# Patient Record
Sex: Male | Born: 1975
Health system: Southern US, Community
[De-identification: ages and names within clinical notes are randomized; demographics above are authoritative.]

## PROBLEM LIST (undated history)

## (undated) DIAGNOSIS — E119 Type 2 diabetes mellitus without complications: Secondary | ICD-10-CM

## (undated) DIAGNOSIS — I10 Essential (primary) hypertension: Secondary | ICD-10-CM

## (undated) HISTORY — DX: Type 2 diabetes mellitus without complications: E11.9

## (undated) HISTORY — DX: Essential (primary) hypertension: I10

---

## 1999-11-28 ENCOUNTER — Emergency Department (HOSPITAL_COMMUNITY): Admission: EM | Admit: 1999-11-28 | Discharge: 1999-11-29 | Payer: Self-pay | Admitting: Emergency Medicine

## 2000-01-27 ENCOUNTER — Emergency Department (HOSPITAL_COMMUNITY): Admission: EM | Admit: 2000-01-27 | Discharge: 2000-01-27 | Payer: Self-pay | Admitting: Emergency Medicine

## 2000-02-29 ENCOUNTER — Ambulatory Visit (HOSPITAL_BASED_OUTPATIENT_CLINIC_OR_DEPARTMENT_OTHER): Admission: RE | Admit: 2000-02-29 | Discharge: 2000-02-29 | Payer: Self-pay | Admitting: Orthopedic Surgery

## 2002-09-06 ENCOUNTER — Emergency Department (HOSPITAL_COMMUNITY): Admission: EM | Admit: 2002-09-06 | Discharge: 2002-09-06 | Payer: Self-pay | Admitting: Emergency Medicine

## 2002-09-06 ENCOUNTER — Encounter: Payer: Self-pay | Admitting: Emergency Medicine

## 2005-07-25 ENCOUNTER — Emergency Department (HOSPITAL_COMMUNITY): Admission: EM | Admit: 2005-07-25 | Discharge: 2005-07-25 | Payer: Self-pay | Admitting: Emergency Medicine

## 2007-09-07 ENCOUNTER — Emergency Department (HOSPITAL_COMMUNITY): Admission: EM | Admit: 2007-09-07 | Discharge: 2007-09-07 | Payer: Self-pay | Admitting: Emergency Medicine

## 2008-03-23 ENCOUNTER — Emergency Department (HOSPITAL_COMMUNITY): Admission: EM | Admit: 2008-03-23 | Discharge: 2008-03-23 | Payer: Self-pay | Admitting: Family Medicine

## 2008-08-05 ENCOUNTER — Emergency Department (HOSPITAL_COMMUNITY): Admission: EM | Admit: 2008-08-05 | Discharge: 2008-08-05 | Payer: Self-pay | Admitting: Family Medicine

## 2009-05-21 ENCOUNTER — Emergency Department (HOSPITAL_COMMUNITY): Admission: EM | Admit: 2009-05-21 | Discharge: 2009-05-21 | Payer: Self-pay | Admitting: Family Medicine

## 2010-02-26 ENCOUNTER — Emergency Department (HOSPITAL_COMMUNITY): Admission: EM | Admit: 2010-02-26 | Discharge: 2010-02-26 | Payer: Self-pay | Admitting: Emergency Medicine

## 2010-10-29 LAB — COMPREHENSIVE METABOLIC PANEL
ALT: 37 U/L (ref 0–53)
Albumin: 4.1 g/dL (ref 3.5–5.2)
Alkaline Phosphatase: 55 U/L (ref 39–117)
GFR calc Af Amer: >60 mL/min (ref >60)
GFR calc non Af Amer: >60 mL/min (ref >60)
Glucose, Bld: 156 mg/dL — ABNORMAL HIGH (ref 70–99)
Potassium: 3.7 mEq/L (ref 3.5–5.1)
Sodium: 141 mEq/L (ref 135–145)
Total Bilirubin: 0.3 mg/dL (ref 0.3–1.2)

## 2010-10-29 LAB — CBC
HCT: 44.3 % (ref 39.0–52.0)
Platelets: 241 10*3/uL (ref 150–400)
WBC: 8.6 10*3/uL (ref 4.0–10.5)

## 2010-10-29 LAB — PROTIME-INR: Prothrombin Time: 13 seconds (ref 11.6–15.2)

## 2010-10-29 LAB — APTT: aPTT: 27 seconds (ref 24–37)

## 2010-12-30 NOTE — Op Note (Signed)
[place]  Patient:    Jesus Gray, Jesus Gray                   MRN: [id number] Proc. Date: 02/29/00 Adm. Date:  [id number] Disc. Date: [id number] Attending:  Ronne Binning CC:         Nicki Reaper, M.D. x 2                           Operative Report  PREOPERATIVE DIAGNOSIS:  Status post repair of extensor tendon laceration with rupture.  POSTOPERATIVE DIAGNOSIS:  Status post repair of extensor tendon laceration with rupture.  OPERATION:  Re-repair of extensor tendon of right index finger.  SURGEON:  Nicki Reaper, M.D.  ASSISTANT:  RN.  ANESTHESIA:  General.  INDICATIONS:  The patient is a 35 year old male who suffered a laceration to the extensor tendon.  He was originally seen where a repair was performed in an emergency room.  He was referred for continued care.  He has been seen and followed.  However, has had lack of the ability to fully extend.  He is admitted now for re-exploration and tenolysis or re-repair.  DESCRIPTION OF PROCEDURE:  The patient was brought to the operating room where a general anesthesia was carried out without difficulty.  He was prepped and draped using Betadine scrubbing solution with the right arm free.  The limb was exsanguinated with an Esmarch bandage.  The tourniquet placed high on the arm and was inflated to 250 mmHg.  A volar dorsal zigzag incision was made and carried down through the subcutaneous tissue.  The extensor tendon was identified and a rupture was immediately apparent.  A tenolysis was then performed to the extensor tendon.  The tendon was then re-repaired after removal of the scar which had formed between the torn end.  Repair was performed with figure-of-eight 4-0 Mersilene sutures.  The wound was copiously irrigated with saline and the skin was closed with interrupted 5-0 nylon suture.  A sterile compressive dressing and splint was applied.  The patient tolerated the procedure well  and was taken to the recovery room for observation in satisfactory condition.  He is discharged home to return to the [facility] in one week on Vicodin and Keflex. DD:  02/29/00 TD:  03/02/00 Job: [id number]
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
## 2011-05-19 LAB — CULTURE, ROUTINE-ABSCESS: Culture: NO GROWTH

## 2013-03-20 ENCOUNTER — Other Ambulatory Visit: Payer: Self-pay | Admitting: Family Medicine

## 2013-03-20 DIAGNOSIS — M79605 Pain in left leg: Secondary | ICD-10-CM

## 2013-03-21 ENCOUNTER — Ambulatory Visit
Admission: RE | Admit: 2013-03-21 | Discharge: 2013-03-21 | Disposition: A | Discharge status: Home / Self Care | Payer: 59 | Source: Ambulatory Visit | Attending: Family Medicine | Admitting: Family Medicine

## 2013-03-21 DIAGNOSIS — M79605 Pain in left leg: Secondary | ICD-10-CM

## 2013-06-10 ENCOUNTER — Encounter: Payer: 59 | Attending: Family Medicine | Admitting: Dietician

## 2013-06-10 VITALS — Ht 69.0 in | Wt 228.0 lb

## 2013-06-10 DIAGNOSIS — Z713 Dietary counseling and surveillance: Secondary | ICD-10-CM | POA: Insufficient documentation

## 2013-06-10 DIAGNOSIS — E119 Type 2 diabetes mellitus without complications: Secondary | ICD-10-CM | POA: Insufficient documentation

## 2013-06-11 ENCOUNTER — Encounter: Payer: Self-pay | Admitting: Dietician

## 2013-06-11 NOTE — Patient Instructions (Signed)
Goals:  Monitor glucose levels as instructed by your doctor  Bring food record and glucose log to your next nutrition visit 

## 2013-06-11 NOTE — Progress Notes (Signed)
Patient was seen on 06/10/13 for the first of a series of three diabetes self-management courses at the [facility].  Current HbA1c: 6.6 on 10/14  The following learning objectives were met by the patient during this class:  Describe diabetes  State some common risk factors for diabetes  Defines the role of glucose and insulin  Identifies type of diabetes and pathophysiology  Describe the relationship between diabetes and cardiovascular risk  State the members of the Healthcare Team  States the rationale for glucose monitoring  State when to test glucose  State their individual Target Range  State the importance of logging glucose readings  Describe how to interpret glucose readings  Identifies A1C target  Explain the correlation between A1c and eAG values  State symptoms and treatment of high blood glucose  State symptoms and treatment of low blood glucose  Explain proper technique for glucose testing  Identifies proper sharps disposal  Handouts given during class include:  Living Well with Diabetes book  Carb Counting and Meal Planning book  Meal Plan Card  Carbohydrate guide  Meal planning worksheet  Low Sodium Flavoring Tips  The diabetes portion plate  Low Carbohydrate Snack Suggestions  A1c to eAG Conversion Chart  Diabetes Medications  Stress Management  Diabetes Recommended Care Schedule  Diabetes Success Plan  Core Class Satisfaction Survey  Your patient has identified their diabetes care support plan as:  [facility]  Staff  Wife Follow-Up Plan:  Attend core 2

## 2013-07-19 ENCOUNTER — Encounter: Payer: 59 | Attending: Family Medicine

## 2013-07-19 DIAGNOSIS — E119 Type 2 diabetes mellitus without complications: Secondary | ICD-10-CM | POA: Insufficient documentation

## 2013-07-19 DIAGNOSIS — Z713 Dietary counseling and surveillance: Secondary | ICD-10-CM | POA: Insufficient documentation

## 2013-07-19 NOTE — Progress Notes (Signed)

## 2013-07-22 NOTE — Progress Notes (Signed)
Patient was seen on 07/19/13 for core two and three of series of three diabetes self-management courses at the [facility]. The following learning objectives were met by the patient during this class:    State the amount of activity recommended for healthy living   Describe activities suitable for individual needs   Identify ways to regularly incorporate activity into daily life   Identify barriers to activity and ways to over come these barriers  Identify diabetes medications being personally used and their primary action for lowering glucose and possible side effects   Describe role of stress on blood glucose and develop strategies to address psychosocial issues   Identify diabetes complications and ways to prevent them  Explain how to manage diabetes during illness   Evaluate success in meeting personal goal   Establish 2-3 goals that they will plan to diligently work on until they return for the  34-month follow-up visit  Goals:  Follow Diabetes Meal Plan as instructed  Aim for 15-30 mins of physical activity daily as tolerated  Bring food record and glucose log to your follow up visit  Your patient has established the following 4 month goals in their individualized success plan:  Count carbohydrates at most meal and snacks  Reduce fat in my diet by eating less fat at two or more meals a day  Increase activity at least 3 days a week  Test my glucose at least 2 times a day, 7 days a week  Your patient has identified these potential barriers to change:  None noted  Your patient has identified their diabetes self-care support plan as  [facility] Support Group

## 2013-11-17 ENCOUNTER — Encounter: Payer: 59 | Attending: Family Medicine | Admitting: Dietician

## 2013-11-17 VITALS — Ht 69.0 in | Wt 194.8 lb

## 2013-11-17 DIAGNOSIS — E119 Type 2 diabetes mellitus without complications: Secondary | ICD-10-CM

## 2013-11-17 DIAGNOSIS — Z713 Dietary counseling and surveillance: Secondary | ICD-10-CM | POA: Insufficient documentation

## 2013-11-18 NOTE — Progress Notes (Signed)
Patient was seen on 11/18/2013 for a review of the series of three diabetes self-management courses at the [facility]. The following learning objectives were met by the patient during this class:    Reviewed blood glucose monitoring and interpretation including the recommended target ranges and Hgb A1c.    Reviewed on carb counting, importance of regularly scheduled meals/snacks, and meal planning.    Reviewed the effects of physical activity on glucose levels and long-term glucose control.  Recommended goal of 150 minutes of physical activity/week.   Reviewed patient medications and discussed role of medication on blood glucose and possible side effects.   Discussed strategies to manage stress, psychosocial issues, and other obstacles to diabetes management.   Encouraged moderate weight reduction to improve glucose levels.     Reviewed short-term complications: hyper- and hypo-glycemia.  Discussed causes, symptoms, and treatment options.   Reviewed prevention, detection, and treatment of long-term complications.  Discussed the role of prolonged elevated glucose levels on body systems.  Goals:  Follow Diabetes Meal Plan as instructed  Eat 3 meals and 2 snacks, every 3-5 hrs  Limit carbohydrate intake to 45 grams carbohydrate/meal Limit carbohydrate intake to 15 grams carbohydrate/snack Add lean protein foods to meals/snacks  Monitor glucose levels as instructed by your doctor  Aim for goal of 15-30 mins of physical activity daily as tolerated  Bring food record and glucose log to your next nutrition visit                        Patient ID: ,    DOB: , 38 y.o.   MRN: [id number]  HPI   Review of Systems     Physical Exam

## 2013-11-20 ENCOUNTER — Ambulatory Visit: Payer: 59 | Admitting: *Deleted

## 2017-02-23 DIAGNOSIS — I10 Essential (primary) hypertension: Secondary | ICD-10-CM | POA: Diagnosis not present

## 2017-03-13 DIAGNOSIS — R1012 Left upper quadrant pain: Secondary | ICD-10-CM | POA: Diagnosis not present

## 2017-03-13 DIAGNOSIS — M799 Soft tissue disorder, unspecified: Secondary | ICD-10-CM | POA: Diagnosis not present

## 2017-03-19 DIAGNOSIS — R1900 Intra-abdominal and pelvic swelling, mass and lump, unspecified site: Secondary | ICD-10-CM | POA: Diagnosis not present

## 2017-03-20 ENCOUNTER — Other Ambulatory Visit: Payer: Self-pay | Admitting: Family Medicine

## 2017-03-21 ENCOUNTER — Other Ambulatory Visit: Payer: Self-pay | Admitting: Family Medicine

## 2017-03-21 DIAGNOSIS — R1900 Intra-abdominal and pelvic swelling, mass and lump, unspecified site: Secondary | ICD-10-CM

## 2017-03-22 ENCOUNTER — Ambulatory Visit
Admission: RE | Admit: 2017-03-22 | Discharge: 2017-03-22 | Disposition: A | Discharge status: Home / Self Care | Payer: 59 | Source: Ambulatory Visit | Attending: Family Medicine | Admitting: Family Medicine

## 2017-03-22 DIAGNOSIS — R1900 Intra-abdominal and pelvic swelling, mass and lump, unspecified site: Secondary | ICD-10-CM | POA: Diagnosis not present

## 2018-05-09 DIAGNOSIS — Z131 Encounter for screening for diabetes mellitus: Secondary | ICD-10-CM | POA: Diagnosis not present

## 2018-05-13 DIAGNOSIS — R300 Dysuria: Secondary | ICD-10-CM | POA: Diagnosis not present

## 2018-05-13 DIAGNOSIS — N342 Other urethritis: Secondary | ICD-10-CM | POA: Diagnosis not present

## 2018-06-21 DIAGNOSIS — E119 Type 2 diabetes mellitus without complications: Secondary | ICD-10-CM | POA: Diagnosis not present

## 2018-06-21 DIAGNOSIS — Z23 Encounter for immunization: Secondary | ICD-10-CM | POA: Diagnosis not present

## 2018-06-21 DIAGNOSIS — Z0000 Encounter for general adult medical examination without abnormal findings: Secondary | ICD-10-CM | POA: Diagnosis not present

## 2018-06-21 DIAGNOSIS — Z13220 Encounter for screening for lipoid disorders: Secondary | ICD-10-CM | POA: Diagnosis not present

## 2018-09-13 DIAGNOSIS — R079 Chest pain, unspecified: Secondary | ICD-10-CM | POA: Diagnosis not present

## 2019-07-28 ENCOUNTER — Other Ambulatory Visit: Payer: Self-pay

## 2019-07-28 ENCOUNTER — Ambulatory Visit: Payer: 59 | Admitting: Podiatry

## 2019-07-28 DIAGNOSIS — M79674 Pain in right toe(s): Secondary | ICD-10-CM

## 2019-07-28 DIAGNOSIS — B351 Tinea unguium: Secondary | ICD-10-CM

## 2019-07-28 DIAGNOSIS — M79675 Pain in left toe(s): Secondary | ICD-10-CM | POA: Diagnosis not present

## 2019-07-28 MED ORDER — TERBINAFINE HCL 250 MG PO TABS: 250.0000 mg | ORAL_TABLET | Freq: Every day | ORAL | 2 refills | Status: DC

## 2019-07-31 NOTE — Progress Notes (Signed)
   Subjective: 43 y.o. male presenting today as a new patient with a chief complaint of possible nail fungus of the bilateral great toenails that first appeared about 1.5 years ago. He reports associated thickening and discoloration of the nails. He reports pain when trying to trim his nails. He has not had any treatment for the symptoms. Patient is here for further evaluation and treatment.   Past Medical History:  Diagnosis Date  . Diabetes mellitus without complication   . Hypertension     Objective: Physical Exam General: The patient is alert and oriented x3 in no acute distress.  Dermatology: Hyperkeratotic, discolored, thickened, onychodystrophy noted to the bilateral great toenails. Skin is warm, dry and supple bilateral lower extremities. Negative for open lesions or macerations.  Vascular: Palpable pedal pulses bilaterally. No edema or erythema noted. Capillary refill within normal limits.  Neurological: Epicritic and protective threshold grossly intact bilaterally.   Musculoskeletal Exam: Range of motion within normal limits to all pedal and ankle joints bilateral. Muscle strength 5/5 in all groups bilateral.   Assessment: #1 Onychomycosis bilateral great toenails  #2 Hyperkeratotic nails bilateral great toenails   Plan of Care:  #1 Patient was evaluated. #2 Prescription for Lamisil 250 mg #90 provided to patient.  #3 Recommended good shoe gear.  #4 If in 6 months patient would like another round of antifungal, we will order LFT prior to 2nd round of treatment.  #5 Return to clinic as needed.   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    Edrick Kins, DPM [facility]  Dr. Edrick Kins, [address]                                        [place], [place] [address]                Office [phone]  Fax [phone]

## 2019-09-16 ENCOUNTER — Ambulatory Visit: Payer: 59 | Attending: Internal Medicine

## 2019-11-06 ENCOUNTER — Ambulatory Visit: Payer: 59 | Attending: Internal Medicine

## 2019-11-06 DIAGNOSIS — Z23 Encounter for immunization: Secondary | ICD-10-CM

## 2019-11-06 NOTE — Progress Notes (Signed)
   Covid-19 Vaccination Clinic  Name:  Jesus Gray    MRN: [id number] DOB: January 15, 1976  11/06/2019  [name] was observed post Covid-19 immunization for 15 minutes without incident. He was provided with Vaccine Information Sheet and instruction to access the V-Safe system.   [name] was instructed to call 911 with any severe reactions post vaccine: Marland Kitchen Difficulty breathing  . Swelling of face and throat  . A fast heartbeat  . A bad rash all over body  . Dizziness and weakness   Immunizations Administered    Name Date Dose VIS Date Route   Pfizer COVID-19 Vaccine 11/06/2019  4:36 PM 0.3 mL 07/25/2019 Intramuscular   Manufacturer: [employer], [employer]   Lot: [id number]   NDC: [address]-3

## 2019-12-01 ENCOUNTER — Ambulatory Visit: Payer: 59 | Attending: Internal Medicine

## 2019-12-01 DIAGNOSIS — Z23 Encounter for immunization: Secondary | ICD-10-CM

## 2019-12-01 NOTE — Progress Notes (Signed)
   Covid-19 Vaccination Clinic  Name:  GRAM SIEDLECKI    MRN: [id number] DOB: 14-Oct-1975  12/01/2019  Mr. Vanderbeck was observed post Covid-19 immunization for 15 minutes without incident. He was provided with Vaccine Information Sheet and instruction to access the V-Safe system.   Mr. Trueheart was instructed to call 911 with any severe reactions post vaccine: Marland Kitchen Difficulty breathing  . Swelling of face and throat  . A fast heartbeat  . A bad rash all over body  . Dizziness and weakness   Immunizations Administered    Name Date Dose VIS Date Route   Pfizer COVID-19 Vaccine 12/01/2019  4:23 PM 0.3 mL 10/08/2018 Intramuscular   Manufacturer: [employer], [employer]   Lot: [id number]   NDC: [address]-7

## 2022-02-16 ENCOUNTER — Ambulatory Visit
Admission: RE | Admit: 2022-02-16 | Discharge: 2022-02-16 | Disposition: A | Discharge status: Home / Self Care | Payer: 59 | Source: Ambulatory Visit | Attending: Family Medicine | Admitting: Family Medicine

## 2022-02-16 ENCOUNTER — Other Ambulatory Visit: Payer: Self-pay | Admitting: Family Medicine

## 2022-02-16 DIAGNOSIS — M549 Dorsalgia, unspecified: Secondary | ICD-10-CM

## 2022-03-09 ENCOUNTER — Encounter: Payer: Self-pay | Admitting: Physical Medicine & Rehabilitation

## 2022-03-09 ENCOUNTER — Encounter: Payer: 59 | Attending: Physical Medicine & Rehabilitation | Admitting: Physical Medicine & Rehabilitation

## 2022-03-09 VITALS — BP 147/90 | HR 81 | Ht 71.0 in | Wt 230.0 lb

## 2022-03-09 DIAGNOSIS — M5126 Other intervertebral disc displacement, lumbar region: Secondary | ICD-10-CM | POA: Diagnosis not present

## 2022-03-09 MED ORDER — ETODOLAC 400 MG PO TABS: 400.0000 mg | ORAL_TABLET | Freq: Every day | ORAL | 1 refills | Status: AC

## 2022-03-09 NOTE — Patient Instructions (Signed)
Back Exercises These exercises help to make your trunk and back strong. They also help to keep the lower back flexible. Doing these exercises can help to prevent or lessen pain in your lower back. If you have back pain, try to do these exercises 2-3 times each day or as told by your doctor. As you get better, do the exercises once each day. Repeat the exercises more often as told by your doctor. To stop back pain from coming back, do the exercises once each day, or as told by your doctor. Do exercises exactly as told by your doctor. Stop right away if you feel sudden pain or your pain gets worse. Exercises Single knee to chest Do these steps 3-5 times in a row for each leg: Lie on your back on a firm bed or the floor with your legs stretched out. Bring one knee to your chest. Grab your knee or thigh with both hands and hold it in place. Pull on your knee until you feel a gentle stretch in your lower back or butt. Keep doing the stretch for 10-30 seconds. Slowly let go of your leg and straighten it. Pelvic tilt Do these steps 5-10 times in a row: Lie on your back on a firm bed or the floor with your legs stretched out. Bend your knees so they point up to the ceiling. Your feet should be flat on the floor. Tighten your lower belly (abdomen) muscles to press your lower back against the floor. This will make your tailbone point up to the ceiling instead of pointing down to your feet or the floor. Stay in this position for 5-10 seconds while you gently tighten your muscles and breathe evenly. Cat-cow Do these steps until your lower back bends more easily: Get on your hands and knees on a firm bed or the floor. Keep your hands under your shoulders, and keep your knees under your hips. You may put padding under your knees. Let your head hang down toward your chest. Tighten (contract) the muscles in your belly. Point your tailbone toward the floor so your lower back becomes rounded like the back of a  cat. Stay in this position for 5 seconds. Slowly lift your head. Let the muscles of your belly relax. Point your tailbone up toward the ceiling so your back forms a sagging arch like the back of a cow. Stay in this position for 5 seconds.  Press-ups Do these steps 5-10 times in a row: Lie on your belly (face-down) on a firm bed or the floor. Place your hands near your head, about shoulder-width apart. While you keep your back relaxed and keep your hips on the floor, slowly straighten your arms to raise the top half of your body and lift your shoulders. Do not use your back muscles. You may change where you place your hands to make yourself more comfortable. Stay in this position for 5 seconds. Keep your back relaxed. Slowly return to lying flat on the floor.  Bridges Do these steps 10 times in a row: Lie on your back on a firm bed or the floor. Bend your knees so they point up to the ceiling. Your feet should be flat on the floor. Your arms should be flat at your sides, next to your body. Tighten your butt muscles and lift your butt off the floor until your waist is almost as high as your knees. If you do not feel the muscles working in your butt and the back of   your thighs, slide your feet 1-2 inches (2.5-5 cm) farther away from your butt. Stay in this position for 3-5 seconds. Slowly lower your butt to the floor, and let your butt muscles relax. If this exercise is too easy, try doing it with your arms crossed over your chest. Belly crunches Do these steps 5-10 times in a row: Lie on your back on a firm bed or the floor with your legs stretched out. Bend your knees so they point up to the ceiling. Your feet should be flat on the floor. Cross your arms over your chest. Tip your chin a little bit toward your chest, but do not bend your neck. Tighten your belly muscles and slowly raise your chest just enough to lift your shoulder blades a tiny bit off the floor. Avoid raising your body  higher than that because it can put too much stress on your lower back. Slowly lower your chest and your head to the floor. Back lifts Do these steps 5-10 times in a row: Lie on your belly (face-down) with your arms at your sides, and rest your forehead on the floor. Tighten the muscles in your legs and your butt. Slowly lift your chest off the floor while you keep your hips on the floor. Keep the back of your head in line with the curve in your back. Look at the floor while you do this. Stay in this position for 3-5 seconds. Slowly lower your chest and your face to the floor. Contact a doctor if: Your back pain gets a lot worse when you do an exercise. Your back pain does not get better within 2 hours after you exercise. If you have any of these problems, stop doing the exercises. Do not do them again unless your doctor says it is okay. Get help right away if: You have sudden, very bad back pain. If this happens, stop doing the exercises. Do not do them again unless your doctor says it is okay. This information is not intended to replace advice given to you by your health care provider. Make sure you discuss any questions you have with your health care provider. Document Revised: 10/13/2020 Document Reviewed: 10/13/2020 Elsevier Patient Education  2023 Elsevier Inc.  

## 2022-03-09 NOTE — Progress Notes (Signed)
Subjective:    Patient ID: Jesus Gray, male    DOB: February 18, 1976, 46 y.o.   MRN: [id number]  HPI  CC:  Low back and RIght thigh pain  46 year old male referred by his primary care physician Dr. Laurine Blazer for chronic low back pain.   Onset ~66mo ago, worsened a couple weeks ago Dull achy constant pain but occ becomes sharp , especially with prolonged sitting or climbing steps  Right post thigh pain  The patient is employed 55 hours a week.  Jesus Gray has no issues with self-care.  The patient does not exercise on a regular basis  Spine Xray minimal anterolisthesis L4 on L5  PMH- HTN, DM  Pain Inventory Average Pain 9 Pain Right Now 6 My pain is sharp, tingling, and aching  In the last 24 hours, has pain interfered with the following? General activity 2 Relation with others 2 Enjoyment of life 2 What TIME of day is your pain at its worst? morning , daytime, evening, and night Sleep (in general) Fair  Pain is worse with: walking, bending, sitting, and standing Pain improves with: medication Relief from Meds: 5  walk without assistance ability to climb steps?  yes do you drive?  yes transfers alone  employed # of hrs/week 55 what is your job? NA  No problems in this area  Any changes since last visit?  no  Any changes since last visit?  no    Family History  Problem Relation Age of Onset   Hypertension Other    Hyperlipidemia Other    Diabetes Other    Social History   Socioeconomic History   Marital status: Married    Spouse name: Not on file   Number of children: Not on file   Years of education: Not on file   Highest education level: Not on file  Occupational History   Not on file  Tobacco Use   Smoking status: Not on file   Smokeless tobacco: Not on file  Substance and Sexual Activity   Alcohol use: Not on file   Drug use: Not on file   Sexual activity: Not on file  Other Topics Concern   Not on file  Social History Narrative   Not on file    Social Determinants of Health   Financial Resource Strain: Not on file  Food Insecurity: Not on file  Transportation Needs: Not on file  Physical Activity: Not on file  Stress: Not on file  Social Connections: Not on file   No past surgical history on file. Past Medical History:  Diagnosis Date   Diabetes mellitus without complication (HCC)    Hypertension    BP (!) 147/90   Pulse 81   Ht 5\' 11"  (1.803 m)   Wt 230 lb (104.3 kg)   SpO2 95%   BMI 32.08 kg/m   Opioid Risk Score:   Fall Risk Score:  `1  Depression screen [facility] 2/9     03/09/2022    1:56 PM  Depression screen PHQ 2/9  Decreased Interest 0  Down, Depressed, Hopeless 0  PHQ - 2 Score 0  Altered sleeping 0  Tired, decreased energy 3  Change in appetite 0  Feeling bad or failure about yourself  0  Trouble concentrating 0  Moving slowly or fidgety/restless 0  Suicidal thoughts 0  PHQ-9 Score 3  Difficult doing work/chores Not difficult at all      Review of Systems  Musculoskeletal:  Positive for back pain.  Pain in right hip going down back of leg to knee  All other systems reviewed and are negative.     Objective:   Physical Exam HENT:     Head: Normocephalic and atraumatic.  Eyes:     Extraocular Movements: Extraocular movements intact.     Conjunctiva/sclera: Conjunctivae normal.     Pupils: Pupils are equal, round, and reactive to light.  Musculoskeletal:        General: No tenderness.     Comments: Pain with spine flexion  Skin:    General: Skin is warm and dry.  Neurological:     Mental Status: Jesus Gray is alert and oriented to person, place, and time.     Sensory: Sensation is intact.     Motor: Motor function is intact.     Coordination: Coordination is intact.     Gait: Gait is intact.     Deep Tendon Reflexes:     Reflex Scores:      Patellar reflexes are 1+ on the right side and 1+ on the left side.      Achilles reflexes are 0 on the right side and 1+ on the left side.     Comments: SLR + at 40 deg on Right side causes pain down to RIght posterior thigh   Right side tighter than left side with glut/piriformis stretch  Pain increased with forward flexion of lumbar spine    Psychiatric:        Mood and Affect: Mood normal.        Behavior: Behavior normal.    Sensation normal in bilateral L3-L4-L5 S1 dermatomal distribution        Assessment & Plan:   1.  Low back and right posterior thigh pain.  X-rays show a grade 1 anterolisthesis L4-5 but otherwise no disc space loss.  Sacroiliac joints look okay.  There are some sciatic type symptoms but no neurologic deficits other than absent right Achilles reflex.  Patient also may have high hamstring tendinopathy.  Will refer to physical therapy.  See the patient back in 4 to 6 weeks if no better consider MRI.

## 2022-03-22 NOTE — Therapy (Signed)
OUTPATIENT PHYSICAL THERAPY THORACOLUMBAR EVALUATION   Patient Name: Jesus Gray MRN: [id number] DOB:09/22/1975, 46 y.o., male Today's Date: 03/22/2022    Past Medical History:  Diagnosis Date   Diabetes mellitus without complication (HCC)    Hypertension    No past surgical history on file. Patient Active Problem List   Diagnosis Date Noted   Type II or unspecified type diabetes mellitus without mention of complication, not stated as uncontrolled 07/19/2013    PCP: Mila Palmer  REFERRING PROVIDER: Claudette Laws  REFERRING DIAG: M51.26  Rationale for Evaluation and Treatment Rehabilitation  THERAPY DIAG:  No diagnosis found.  ONSET DATE: 03/09/22  SUBJECTIVE:                                                                                                                                                                                           SUBJECTIVE STATEMENT: Started months ago randomly, and I thought it would just get better but it got worse. Going up stairs to my bed room is hard.   PERTINENT HISTORY:  Type II DM, HTN  PAIN:  Are you having pain? Yes: NPRS scale: 4/10 Pain location: low back more towards right side Pain description: burning, achy Aggravating factors: sitting for too long Relieving factors: pain medicine   PRECAUTIONS: None  WEIGHT BEARING RESTRICTIONS No  FALLS:  Has patient fallen in last 6 months? No  LIVING ENVIRONMENT: Lives with: lives with their family Lives in: House/apartment Stairs: Yes: Internal: 14 steps; on left going up Has following equipment at home: None  OCCUPATION: truck driver   PLOF: Independent  PATIENT GOALS get rid of pain   OBJECTIVE:   DIAGNOSTIC FINDINGS:  FINDINGS: Lumbar vertebral body height are preserved without evidence of fracture. Minimal grade 1 anterolisthesis of L4 on L5. No spondylolysis identified. Intervertebral disc spaces appear preserved.   IMPRESSION: No acute  osseous abnormality identified.  FINDINGS: The sacroiliac joint spaces are maintained and there is no evidence of arthropathy. No other bone abnormalities are seen.   IMPRESSION: Negative.   PATIENT SURVEYS:  FOTO 69/100  SCREENING FOR RED FLAGS: Bowel or bladder incontinence: No Spinal tumors: No Cauda equina syndrome: No Compression fracture: No Abdominal aneurysm: No  COGNITION:  Overall cognitive status: Within functional limits for tasks assessed     SENSATION: WFL  MUSCLE LENGTH: Hamstrings: tightness on both side, more on R. Positive Faber--tightness in IR/ER and piriformis   POSTURE: rounded shoulders, forward head, decreased lumbar lordosis, and flexed trunk   PALPATION: TTP on L1-L5, patient has a jump reaction with light pressure on L3-L4   LUMBAR ROM:   Active  A/PROM  eval  Flexion Full but pain on R side  Extension Limited w/pain on R side  Right lateral flexion WNL  Left lateral flexion WNL  Right rotation Limited by pain  Left rotation Limited by pain   (Blank rows = not tested)  LOWER EXTREMITY ROM:  WFL  LOWER EXTREMITY MMT:  grossly 4+/5 bilaterally but pain with all testing on R side. 4/5 hip abductors and R side hip ext 3+/5  LUMBAR SPECIAL TESTS:  Straight leg raise test: Positive, Slump test: Negative, and FABER test: Positive  FUNCTIONAL TESTS:  5 times sit to stand: 17.51s was painful Timed up and go (TUG): 10.22s  GAIT: Distance walked: in clinic distances  Assistive device utilized: None Level of assistance: Complete Independence Comments: slowed gait, antalgic gait, trendelenberg    TODAY'S TREATMENT  Eval and POC    PATIENT EDUCATION:  Education details: POC Person educated: Patient Education method: Explanation Education comprehension: verbalized understanding   HOME EXERCISE PROGRAM: SK2C bilat 30s Trunk rotations  Bridges   ASSESSMENT:  CLINICAL IMPRESSION: Patient is a 46 y.o. male who was seen today  for physical therapy evaluation and treatment for low back pain. 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 The back pain is acute and started a few months ago. He presents with tightness and is TTP along his lumbar spine. Patient has pain with most lumbar movements and with walking as well. He will benefit from skilled PT intervention to address LBP to improve QOL and ease with work and ADLs.   REHAB POTENTIAL: Good  CLINICAL DECISION MAKING: Stable/uncomplicated  EVALUATION COMPLEXITY: Low   GOALS: Goals reviewed with patient? Yes  SHORT TERM GOALS: Target date: 04/27/22  Patient will be independent with initial HEP.  Goal status: INITIAL   LONG TERM GOALS: Target date: 06/15/22  Patient will be independent with advanced/ongoing HEP to improve outcomes and carryover.  Goal status: INITIAL  2.  Patient will report 75% improvement in low back pain to improve QOL.  Goal status: INITIAL  4.  Patient will demonstrate full pain free lumbar ROM to perform ADLs.   Baseline: 9/10 Goal status: INITIAL  5.  Patient will demonstrate improved functional strength as demonstrated by hip abductor and extensor strength >=4+/5. Goal status: INITIAL  6.  Patient will report 75 on lumbar FOTO to demonstrate improved functional ability.  Baseline: 69 Goal status: INITIAL     PLAN: PT FREQUENCY: 1-2x/week  PT DURATION: 8 weeks  PLANNED INTERVENTIONS: Therapeutic exercises, Therapeutic activity, Neuromuscular re-education, Balance training, Gait training, Patient/Family education, Self Care, Joint mobilization, Dry Needling, Electrical stimulation, Spinal manipulation, Spinal mobilization, Cryotherapy, Moist heat, Taping, Traction, Ionotophoresis 4mg /ml Dexamethasone, and Manual therapy.  PLAN FOR NEXT SESSION: gym activities for low back, stretching    , PT 03/22/2022, 4:10 PM

## 2022-03-23 ENCOUNTER — Ambulatory Visit: Payer: 59 | Attending: Physical Medicine & Rehabilitation

## 2022-03-23 DIAGNOSIS — M5459 Other low back pain: Secondary | ICD-10-CM | POA: Diagnosis present

## 2022-03-23 DIAGNOSIS — M5126 Other intervertebral disc displacement, lumbar region: Secondary | ICD-10-CM | POA: Diagnosis not present

## 2022-03-23 DIAGNOSIS — M6281 Muscle weakness (generalized): Secondary | ICD-10-CM | POA: Diagnosis present

## 2022-03-23 DIAGNOSIS — M5416 Radiculopathy, lumbar region: Secondary | ICD-10-CM | POA: Diagnosis present

## 2022-03-23 DIAGNOSIS — R278 Other lack of coordination: Secondary | ICD-10-CM | POA: Diagnosis present

## 2022-03-29 ENCOUNTER — Ambulatory Visit: Payer: 59 | Admitting: Physical Therapy

## 2022-04-05 ENCOUNTER — Ambulatory Visit: Payer: 59 | Admitting: Physical Therapy

## 2022-04-05 ENCOUNTER — Encounter: Payer: Self-pay | Admitting: Physical Therapy

## 2022-04-05 DIAGNOSIS — M5459 Other low back pain: Secondary | ICD-10-CM

## 2022-04-05 DIAGNOSIS — M5416 Radiculopathy, lumbar region: Secondary | ICD-10-CM

## 2022-04-05 DIAGNOSIS — M6281 Muscle weakness (generalized): Secondary | ICD-10-CM

## 2022-04-05 DIAGNOSIS — R278 Other lack of coordination: Secondary | ICD-10-CM

## 2022-04-05 NOTE — Therapy (Signed)
OUTPATIENT PHYSICAL THERAPY THORACOLUMBAR TREATMENT   Patient Name: Jesus Gray MRN: [id number] DOB:08/22/1975, 46 y.o., male Today's Date: 04/05/2022   PT End of Session - 04/05/22 1519     Visit Number 2    Date for PT Re-Evaluation 06/15/22    PT Start Time 1515    PT Stop Time 1600    PT Time Calculation (min) 45 min    Activity Tolerance Patient tolerated treatment well    Behavior During Therapy [facility] for tasks assessed/performed             Past Medical History:  Diagnosis Date   Diabetes mellitus without complication (HCC)    Hypertension    History reviewed. No pertinent surgical history. Patient Active Problem List   Diagnosis Date Noted   Type II or unspecified type diabetes mellitus without mention of complication, not stated as uncontrolled 07/19/2013    PCP: Mila Palmer  REFERRING PROVIDER: Claudette Laws  REFERRING DIAG: M51.26  Rationale for Evaluation and Treatment Rehabilitation  THERAPY DIAG:  Muscle weakness (generalized)  Other low back pain  Radiculopathy, lumbar region  Other lack of coordination  ONSET DATE: 03/09/22  SUBJECTIVE:                                                                                                                                                                                           SUBJECTIVE STATEMENT: No change since evaluation  PERTINENT HISTORY:  Type II DM, HTN  PAIN:  Are you having pain? Yes: NPRS scale: 0/10 Pain location: low back more towards right side Pain description: burning, achy Aggravating factors: sitting for too long Relieving factors: pain medicine   PRECAUTIONS: None  WEIGHT BEARING RESTRICTIONS No  FALLS:  Has patient fallen in last 6 months? No  LIVING ENVIRONMENT: Lives with: lives with their family Lives in: House/apartment Stairs: Yes: Internal: 14 steps; on left going up Has following equipment at home: None  OCCUPATION: truck driver   PLOF:  Independent  PATIENT GOALS get rid of pain   OBJECTIVE:   DIAGNOSTIC FINDINGS:  FINDINGS: Lumbar vertebral body height are preserved without evidence of fracture. Minimal grade 1 anterolisthesis of L4 on L5. No spondylolysis identified. Intervertebral disc spaces appear preserved.   IMPRESSION: No acute osseous abnormality identified.  FINDINGS: The sacroiliac joint spaces are maintained and there is no evidence of arthropathy. No other bone abnormalities are seen.   IMPRESSION: Negative.   PATIENT SURVEYS:  FOTO 69/100  SCREENING FOR RED FLAGS: Bowel or bladder incontinence: No Spinal tumors: No Cauda equina syndrome: No Compression fracture: No Abdominal aneurysm: No  COGNITION:  Overall cognitive  status: Within functional limits for tasks assessed     SENSATION: WFL  MUSCLE LENGTH: Hamstrings: tightness on both side, more on R. Positive Faber--tightness in IR/ER and piriformis   POSTURE: rounded shoulders, forward head, decreased lumbar lordosis, and flexed trunk   PALPATION: TTP on L1-L5, patient has a jump reaction with light pressure on L3-L4   LUMBAR ROM:   Active  A/PROM  eval  Flexion Full but pain on R side  Extension Limited w/pain on R side  Right lateral flexion WNL  Left lateral flexion WNL  Right rotation Limited by pain  Left rotation Limited by pain   (Blank rows = not tested)  LOWER EXTREMITY ROM:  WFL  LOWER EXTREMITY MMT:  grossly 4+/5 bilaterally but pain with all testing on R side. 4/5 hip abductors and R side hip ext 3+/5  LUMBAR SPECIAL TESTS:  Straight leg raise test: Positive, Slump test: Negative, and FABER test: Positive  FUNCTIONAL TESTS:  5 times sit to stand: 17.51s was painful Timed up and go (TUG): 10.22s  GAIT: Distance walked: in clinic distances  Assistive device utilized: None Level of assistance: Complete Independence Comments: slowed gait, antalgic gait, trendelenberg    TODAY'S TREATMENT   04/05/22 NuStep L5 x 6 min  Rows & Lats 25lb 2x10 Lumbar Ext black 2x10 Shoulder Ext 10lb 2x10 S2S 2x10 Supine bridges x10  LE stretches piriformis, HS, ITB Lumbar stretches single and double K2C   Eval and POC    PATIENT EDUCATION:  Education details: POC Person educated: Patient Education method: Explanation Education comprehension: verbalized understanding   HOME EXERCISE PROGRAM: SK2C bilat 30s Trunk rotations  Bridges   ASSESSMENT:  CLINICAL IMPRESSION: Pt enters reporting no change. He continues to report that he has random bouts of pan. He did well with seated rows and lats. Some postural weakness present with shoulder extensions. Forward founded shoulders noted with sit to stands. Bilateral piriformis and lumbar tightness present with stretching.   REHAB POTENTIAL: Good  CLINICAL DECISION MAKING: Stable/uncomplicated  EVALUATION COMPLEXITY: Low   GOALS: Goals reviewed with patient? Yes  SHORT TERM GOALS: Target date: 04/27/22  Patient will be independent with initial HEP.  Goal status: progressing   LONG TERM GOALS: Target date: 06/15/22  Patient will be independent with advanced/ongoing HEP to improve outcomes and carryover.  Goal status: INITIAL  2.  Patient will report 75% improvement in low back pain to improve QOL.  Goal status: INITIAL  4.  Patient will demonstrate full pain free lumbar ROM to perform ADLs.   Baseline: 9/10 Goal status: INITIAL  5.  Patient will demonstrate improved functional strength as demonstrated by hip abductor and extensor strength >=4+/5. Goal status: INITIAL  6.  Patient will report 28 on lumbar FOTO to demonstrate improved functional ability.  Baseline: 69 Goal status: INITIAL     PLAN: PT FREQUENCY: 1-2x/week  PT DURATION: 8 weeks  PLANNED INTERVENTIONS: Therapeutic exercises, Therapeutic activity, Neuromuscular re-education, Balance training, Gait training, Patient/Family education, Self Care, Joint  mobilization, Dry Needling, Electrical stimulation, Spinal manipulation, Spinal mobilization, Cryotherapy, Moist heat, Taping, Traction, Ionotophoresis 4mg /ml Dexamethasone, and Manual therapy.  PLAN FOR NEXT SESSION: gym activities for low back, stretching    , PTA 04/05/2022, 3:20 PM

## 2022-04-10 ENCOUNTER — Ambulatory Visit: Payer: 59 | Admitting: Physical Therapy

## 2022-04-10 ENCOUNTER — Encounter: Payer: Self-pay | Admitting: Physical Therapy

## 2022-04-10 DIAGNOSIS — M6281 Muscle weakness (generalized): Secondary | ICD-10-CM

## 2022-04-10 DIAGNOSIS — M5459 Other low back pain: Secondary | ICD-10-CM | POA: Diagnosis not present

## 2022-04-10 DIAGNOSIS — R278 Other lack of coordination: Secondary | ICD-10-CM

## 2022-04-10 DIAGNOSIS — M5416 Radiculopathy, lumbar region: Secondary | ICD-10-CM

## 2022-04-10 NOTE — Therapy (Signed)
OUTPATIENT PHYSICAL THERAPY THORACOLUMBAR TREATMENT   Patient Name: Jesus Gray MRN: [id number] DOB:02-10-1976, 46 y.o., male Today's Date: 04/10/2022   PT End of Session - 04/10/22 1601     Visit Number 3    Date for PT Re-Evaluation 06/15/22    PT Start Time 1600    PT Stop Time 1645    PT Time Calculation (min) 45 min    Activity Tolerance Patient tolerated treatment well    Behavior During Therapy [facility] for tasks assessed/performed             Past Medical History:  Diagnosis Date   Diabetes mellitus without complication (HCC)    Hypertension    History reviewed. No pertinent surgical history. Patient Active Problem List   Diagnosis Date Noted   Type II or unspecified type diabetes mellitus without mention of complication, not stated as uncontrolled 07/19/2013    PCP: Mila Palmer  REFERRING PROVIDER: Claudette Laws  REFERRING DIAG: M51.26  Rationale for Evaluation and Treatment Rehabilitation  THERAPY DIAG:  Muscle weakness (generalized)  Other low back pain  Radiculopathy, lumbar region  Other lack of coordination  ONSET DATE: 03/09/22  SUBJECTIVE:                                                                                                                                                                                           SUBJECTIVE STATEMENT: "Off and on, hurting the it will stop"  PERTINENT HISTORY:  Type II DM, HTN  PAIN:  Are you having pain? Yes: NPRS scale: 4/10 Pain location: low back more towards right side Pain description: burning, achy Aggravating factors: sitting for too long Relieving factors: pain medicine   PRECAUTIONS: None  WEIGHT BEARING RESTRICTIONS No  FALLS:  Has patient fallen in last 6 months? No  LIVING ENVIRONMENT: Lives with: lives with their family Lives in: House/apartment Stairs: Yes: Internal: 14 steps; on left going up Has following equipment at home: None  OCCUPATION: truck driver    PLOF: Independent  PATIENT GOALS get rid of pain   OBJECTIVE:   DIAGNOSTIC FINDINGS:  FINDINGS: Lumbar vertebral body height are preserved without evidence of fracture. Minimal grade 1 anterolisthesis of L4 on L5. No spondylolysis identified. Intervertebral disc spaces appear preserved.   IMPRESSION: No acute osseous abnormality identified.  FINDINGS: The sacroiliac joint spaces are maintained and there is no evidence of arthropathy. No other bone abnormalities are seen.   IMPRESSION: Negative.   PATIENT SURVEYS:  FOTO 69/100  SCREENING FOR RED FLAGS: Bowel or bladder incontinence: No Spinal tumors: No Cauda equina syndrome: No Compression fracture: No Abdominal aneurysm: No  COGNITION:  Overall cognitive status: Within functional limits for tasks assessed     SENSATION: WFL  MUSCLE LENGTH: Hamstrings: tightness on both side, more on R. Positive Faber--tightness in IR/ER and piriformis   POSTURE: rounded shoulders, forward head, decreased lumbar lordosis, and flexed trunk   PALPATION: TTP on L1-L5, patient has a jump reaction with light pressure on L3-L4   LUMBAR ROM:   Active  A/PROM  eval  Flexion Full but pain on R side  Extension Limited w/pain on R side  Right lateral flexion WNL  Left lateral flexion WNL  Right rotation Limited by pain  Left rotation Limited by pain   (Blank rows = not tested)  LOWER EXTREMITY ROM:  WFL  LOWER EXTREMITY MMT:  grossly 4+/5 bilaterally but pain with all testing on R side. 4/5 hip abductors and R side hip ext 3+/5  LUMBAR SPECIAL TESTS:  Straight leg raise test: Positive, Slump test: Negative, and FABER test: Positive  FUNCTIONAL TESTS:  5 times sit to stand: 17.51s was painful Timed up and go (TUG): 10.22s  GAIT: Distance walked: in clinic distances  Assistive device utilized: None Level of assistance: Complete Independence Comments: slowed gait, antalgic gait, trendelenberg    TODAY'S TREATMENT   04/10/22 NuStep L5 x x6 min S2S OHP yellow ball 2x10 AR press 20lb x10 each  Hamstring curls 35lb 2x10  Leg Ext 10lb 2x10  Shoulder Ext 10lb 2x10 Rows 15lb 2x10 LE on pball Bridges, K2C, Oblq Prone on elbows 4x 10''  Prone opposites x5 each  04/05/22 NuStep L5 x 6 min  Rows & Lats 25lb 2x10 Lumbar Ext black 2x10 Shoulder Ext 10lb 2x10 S2S 2x10 Supine bridges x10  LE stretches piriformis, HS, ITB Lumbar stretches single and double K2C   Eval and POC    PATIENT EDUCATION:  Education details: POC Person educated: Patient Education method: Explanation Education comprehension: verbalized understanding   HOME EXERCISE PROGRAM: SK2C bilat 30s Trunk rotations  Bridges   ASSESSMENT:  CLINICAL IMPRESSION: Pt enters feeling well overall. He stated that he did have some sourness the day after last session, but it went away. Session consisted of core and postural strength. Postural weakness present with standing shoulder extension. Posterior chain weakness reported with LE on pball interventions Pt did stated prone on elbows made pain go away.  REHAB POTENTIAL: Good  CLINICAL DECISION MAKING: Stable/uncomplicated  EVALUATION COMPLEXITY: Low   GOALS: Goals reviewed with patient? Yes  SHORT TERM GOALS: Target date: 04/27/22  Patient will be independent with initial HEP.  Goal status: progressing   LONG TERM GOALS: Target date: 06/15/22  Patient will be independent with advanced/ongoing HEP to improve outcomes and carryover.  Goal status: INITIAL  2.  Patient will report 75% improvement in low back pain to improve QOL.  Goal status: INITIAL  4.  Patient will demonstrate full pain free lumbar ROM to perform ADLs.   Baseline: 9/10 Goal status: INITIAL  5.  Patient will demonstrate improved functional strength as demonstrated by hip abductor and extensor strength >=4+/5. Goal status: INITIAL  6.  Patient will report 3 on lumbar FOTO to demonstrate improved  functional ability.  Baseline: 69 Goal status: INITIAL     PLAN: PT FREQUENCY: 1-2x/week  PT DURATION: 8 weeks  PLANNED INTERVENTIONS: Therapeutic exercises, Therapeutic activity, Neuromuscular re-education, Balance training, Gait training, Patient/Family education, Self Care, Joint mobilization, Dry Needling, Electrical stimulation, Spinal manipulation, Spinal mobilization, Cryotherapy, Moist heat, Taping, Traction, Ionotophoresis 4mg /ml Dexamethasone, and Manual therapy.  PLAN  FOR NEXT SESSION: gym activities for low back, stretching    Grayce Sessions, PTA 04/10/2022, 4:02 PM

## 2022-04-11 NOTE — Therapy (Signed)
OUTPATIENT PHYSICAL THERAPY THORACOLUMBAR TREATMENT   Patient Name: Jesus Gray MRN: [id number] DOB:November 08, 1975, 46 y.o., male Today's Date: 04/12/2022   PT End of Session - 04/12/22 1616     Visit Number 4    Date for PT Re-Evaluation 06/15/22    PT Start Time 1615    PT Stop Time 1700    PT Time Calculation (min) 45 min    Activity Tolerance Patient tolerated treatment well    Behavior During Therapy [facility] for tasks assessed/performed              Past Medical History:  Diagnosis Date   Diabetes mellitus without complication (HCC)    Hypertension    History reviewed. No pertinent surgical history. Patient Active Problem List   Diagnosis Date Noted   Type II or unspecified type diabetes mellitus without mention of complication, not stated as uncontrolled 07/19/2013    PCP: Mila Palmer  REFERRING PROVIDER: Claudette Laws  REFERRING DIAG: M51.26  Rationale for Evaluation and Treatment Rehabilitation  THERAPY DIAG:  Muscle weakness (generalized)  Other low back pain  Radiculopathy, lumbar region  Other lack of coordination  ONSET DATE: 03/09/22  SUBJECTIVE:                                                                                                                                                                                           SUBJECTIVE STATEMENT: Feel about the same  PERTINENT HISTORY:  Type II DM, HTN  PAIN:  Are you having pain? Yes: NPRS scale: 4/10 Pain location: low back more towards right side Pain description: burning, achy Aggravating factors: sitting for too long Relieving factors: pain medicine   PRECAUTIONS: None  WEIGHT BEARING RESTRICTIONS No  FALLS:  Has patient fallen in last 6 months? No  LIVING ENVIRONMENT: Lives with: lives with their family Lives in: House/apartment Stairs: Yes: Internal: 14 steps; on left going up Has following equipment at home: None  OCCUPATION: truck driver   PLOF:  Independent  PATIENT GOALS get rid of pain   OBJECTIVE:   DIAGNOSTIC FINDINGS:  FINDINGS: Lumbar vertebral body height are preserved without evidence of fracture. Minimal grade 1 anterolisthesis of L4 on L5. No spondylolysis identified. Intervertebral disc spaces appear preserved.   IMPRESSION: No acute osseous abnormality identified.  FINDINGS: The sacroiliac joint spaces are maintained and there is no evidence of arthropathy. No other bone abnormalities are seen.   IMPRESSION: Negative.   PATIENT SURVEYS:  FOTO 69/100  SCREENING FOR RED FLAGS: Bowel or bladder incontinence: No Spinal tumors: No Cauda equina syndrome: No Compression fracture: No Abdominal aneurysm: No  COGNITION:  Overall  cognitive status: Within functional limits for tasks assessed     SENSATION: WFL  MUSCLE LENGTH: Hamstrings: tightness on both side, more on R. Positive Faber--tightness in IR/ER and piriformis   POSTURE: rounded shoulders, forward head, decreased lumbar lordosis, and flexed trunk   PALPATION: TTP on L1-L5, patient has a jump reaction with light pressure on L3-L4   LUMBAR ROM:   Active  A/PROM  eval  Flexion Full but pain on R side  Extension Limited w/pain on R side  Right lateral flexion WNL  Left lateral flexion WNL  Right rotation Limited by pain  Left rotation Limited by pain   (Blank rows = not tested)  LOWER EXTREMITY ROM:  WFL  LOWER EXTREMITY MMT:  grossly 4+/5 bilaterally but pain with all testing on R side. 4/5 hip abductors and R side hip ext 3+/5  LUMBAR SPECIAL TESTS:  Straight leg raise test: Positive, Slump test: Negative, and FABER test: Positive  FUNCTIONAL TESTS:  5 times sit to stand: 17.51s was painful Timed up and go (TUG): 10.22s  GAIT: Distance walked: in clinic distances  Assistive device utilized: None Level of assistance: Complete Independence Comments: slowed gait, antalgic gait, trendelenberg    TODAY'S TREATMENT   04/12/22 Nustep L5 x43mins  Prone on elbows 2x10, 5s hold  Prone push ups arms straight 2x10, 5s hold Cobra stretch 30s  Prone supermans 2x10 Birddogs 2x10  Pallof press 20# 2x10  blackTB crunches 2x10 BlackTB ext 2x10    04/10/22 NuStep L5 x x6 min S2S OHP yellow ball 2x10 AR press 20lb x10 each  Hamstring curls 35lb 2x10  Leg Ext 10lb 2x10  Shoulder Ext 10lb 2x10 Rows 15lb 2x10 LE on pball Bridges, K2C, Oblq Prone on elbows 4x 10''  Prone opposites x5 each  04/05/22 NuStep L5 x 6 min  Rows & Lats 25lb 2x10 Lumbar Ext black 2x10 Shoulder Ext 10lb 2x10 S2S 2x10 Supine bridges x10  LE stretches piriformis, HS, ITB Lumbar stretches single and double K2C   Eval and POC    PATIENT EDUCATION:  Education details: POC Person educated: Patient Education method: Explanation Education comprehension: verbalized understanding   HOME EXERCISE PROGRAM: SK2C bilat 30s Trunk rotations  Bridges   ASSESSMENT:  CLINICAL IMPRESSION: Per last visit, prone exercises helped with the pain therefore we focused on more prone exercises today. Also included core exercises to help with stability and to decrease low back pain. Some fatigue throughout session but no increase in pain. Progress as tolerated.   REHAB POTENTIAL: Good  CLINICAL DECISION MAKING: Stable/uncomplicated  EVALUATION COMPLEXITY: Low   GOALS: Goals reviewed with patient? Yes  SHORT TERM GOALS: Target date: 04/27/22  Patient will be independent with initial HEP.  Goal status: progressing   LONG TERM GOALS: Target date: 06/15/22  Patient will be independent with advanced/ongoing HEP to improve outcomes and carryover.  Goal status: INITIAL  2.  Patient will report 75% improvement in low back pain to improve QOL.  Goal status: INITIAL  4.  Patient will demonstrate full pain free lumbar ROM to perform ADLs.   Baseline: 9/10 Goal status: INITIAL  5.  Patient will demonstrate improved functional strength  as demonstrated by hip abductor and extensor strength >=4+/5. Goal status: INITIAL  6.  Patient will report 74 on lumbar FOTO to demonstrate improved functional ability.  Baseline: 69 Goal status: INITIAL     PLAN: PT FREQUENCY: 1-2x/week  PT DURATION: 8 weeks  PLANNED INTERVENTIONS: Therapeutic exercises, Therapeutic activity, Neuromuscular re-education, Balance  training, Gait training, Patient/Family education, Self Care, Joint mobilization, Dry Needling, Electrical stimulation, Spinal manipulation, Spinal mobilization, Cryotherapy, Moist heat, Taping, Traction, Ionotophoresis 4mg /ml Dexamethasone, and Manual therapy.  PLAN FOR NEXT SESSION: gym activities for low back, stretching    Andris Baumann, PT 04/12/2022, 5:01 PM

## 2022-04-12 ENCOUNTER — Ambulatory Visit: Payer: 59

## 2022-04-12 DIAGNOSIS — M5459 Other low back pain: Secondary | ICD-10-CM | POA: Diagnosis not present

## 2022-04-12 DIAGNOSIS — M5416 Radiculopathy, lumbar region: Secondary | ICD-10-CM

## 2022-04-12 DIAGNOSIS — R278 Other lack of coordination: Secondary | ICD-10-CM

## 2022-04-12 DIAGNOSIS — M6281 Muscle weakness (generalized): Secondary | ICD-10-CM

## 2022-04-18 ENCOUNTER — Ambulatory Visit: Payer: 59 | Attending: Physical Medicine & Rehabilitation | Admitting: Physical Therapy

## 2022-04-18 ENCOUNTER — Encounter: Payer: Self-pay | Admitting: Physical Therapy

## 2022-04-18 DIAGNOSIS — M5416 Radiculopathy, lumbar region: Secondary | ICD-10-CM | POA: Insufficient documentation

## 2022-04-18 DIAGNOSIS — M6281 Muscle weakness (generalized): Secondary | ICD-10-CM | POA: Diagnosis present

## 2022-04-18 DIAGNOSIS — R278 Other lack of coordination: Secondary | ICD-10-CM | POA: Insufficient documentation

## 2022-04-18 DIAGNOSIS — M5459 Other low back pain: Secondary | ICD-10-CM | POA: Diagnosis present

## 2022-04-18 NOTE — Therapy (Signed)
OUTPATIENT PHYSICAL THERAPY THORACOLUMBAR TREATMENT   Patient Name: Jesus Gray MRN: [id number] DOB:25-Feb-1976, 46 y.o., male Today's Date: 04/18/2022   PT End of Session - 04/18/22 1607     Visit Number 5    Date for PT Re-Evaluation 06/15/22    PT Start Time 1607    PT Stop Time 1657    PT Time Calculation (min) 50 min    Activity Tolerance Patient tolerated treatment well    Behavior During Therapy [facility] for tasks assessed/performed              Past Medical History:  Diagnosis Date   Diabetes mellitus without complication ([place])    Hypertension    History reviewed. No pertinent surgical history. Patient Active Problem List   Diagnosis Date Noted   Type II or unspecified type diabetes mellitus without mention of complication, not stated as uncontrolled 07/19/2013    PCP: Jonathon Jordan  REFERRING PROVIDER: Alysia Penna  REFERRING DIAG: M51.26  Rationale for Evaluation and Treatment Rehabilitation  THERAPY DIAG:  Muscle weakness (generalized)  Other low back pain  Radiculopathy, lumbar region  Other lack of coordination  ONSET DATE: 03/09/22  SUBJECTIVE:                                                                                                                                                                                           SUBJECTIVE STATEMENT: I have not felt bad today, I think this may be helping PERTINENT HISTORY:  Type II DM, HTN  PAIN:  Are you having pain? Yes: NPRS scale: 4/10 Pain location: low back more towards right side Pain description: burning, achy Aggravating factors: sitting for too long Relieving factors: pain medicine   PRECAUTIONS: None  WEIGHT BEARING RESTRICTIONS No  FALLS:  Has patient fallen in last 6 months? No  LIVING ENVIRONMENT: Lives with: lives with their family Lives in: House/apartment Stairs: Yes: Internal: 14 steps; on left going up Has following equipment at home:  None  OCCUPATION: truck driver   PLOF: Independent  PATIENT GOALS get rid of pain   OBJECTIVE:   DIAGNOSTIC FINDINGS:  FINDINGS: Lumbar vertebral body height are preserved without evidence of fracture. Minimal grade 1 anterolisthesis of L4 on L5. No spondylolysis identified. Intervertebral disc spaces appear preserved.   IMPRESSION: No acute osseous abnormality identified.  FINDINGS: The sacroiliac joint spaces are maintained and there is no evidence of arthropathy. No other bone abnormalities are seen.   IMPRESSION: Negative.   PATIENT SURVEYS:  FOTO 69/100  SCREENING FOR RED FLAGS: Bowel or bladder incontinence: No Spinal tumors: No Cauda equina syndrome: No Compression fracture: No  Abdominal aneurysm: No  COGNITION:  Overall cognitive status: Within functional limits for tasks assessed     SENSATION: WFL  MUSCLE LENGTH: Hamstrings: tightness on both side, more on R. Positive Faber--tightness in IR/ER and piriformis   POSTURE: rounded shoulders, forward head, decreased lumbar lordosis, and flexed trunk   PALPATION: TTP on L1-L5, patient has a jump reaction with light pressure on L3-L4   LUMBAR ROM:   Active  A/PROM  eval  Flexion Full but pain on R side  Extension Limited w/pain on R side  Right lateral flexion WNL  Left lateral flexion WNL  Right rotation Limited by pain  Left rotation Limited by pain   (Blank rows = not tested)  LOWER EXTREMITY ROM:  WFL  LOWER EXTREMITY MMT:  grossly 4+/5 bilaterally but pain with all testing on R side. 4/5 hip abductors and R side hip ext 3+/5  LUMBAR SPECIAL TESTS:  Straight leg raise test: Positive, Slump test: Negative, and FABER test: Positive  FUNCTIONAL TESTS:  5 times sit to stand: 17.51s was painful Timed up and go (TUG): 10.22s  GAIT: Distance walked: in clinic distances  Assistive device utilized: None Level of assistance: Complete Independence Comments: slowed gait, antalgic gait,  trendelenberg    TODAY'S TREATMENT  04/18/22 Nustep level 5 x 6 minutes 10# straight arm pulls 5# hip extension and abduction 15# palloff press 35# HS curls Leg extension 10# 2x10 35# Lats 20# farmer carry Feet on ball K2C, trunk rotation, small bridges and isometric abs  04/12/22 Nustep L5 x44mns  Prone on elbows 2x10, 5s hold  Prone push ups arms straight 2x10, 5s hold Cobra stretch 30s  Prone supermans 2x10 Birddogs 2x10  Pallof press 20# 2x10  blackTB crunches 2x10 BlackTB ext 2x10    04/10/22 NuStep L5 x x6 min S2S OHP yellow ball 2x10 AR press 20lb x10 each  Hamstring curls 35lb 2x10  Leg Ext 10lb 2x10  Shoulder Ext 10lb 2x10 Rows 15lb 2x10 LE on pball Bridges, K2C, Oblq Prone on elbows 4x 10''  Prone opposites x5 each  04/05/22 NuStep L5 x 6 min  Rows & Lats 25lb 2x10 Lumbar Ext black 2x10 Shoulder Ext 10lb 2x10 S2S 2x10 Supine bridges x10  LE stretches piriformis, HS, ITB Lumbar stretches single and double K2C   Eval and POC    PATIENT EDUCATION:  Education details: POC Person educated: Patient Education method: Explanation Education comprehension: verbalized understanding   HOME EXERCISE PROGRAM: SK2C bilat 30s Trunk rotations  Bridges   ASSESSMENT:  CLINICAL IMPRESSION: Continue to push stability and core.  He had the most difficulty with the hip abduction exercises today, nothing increased LBP except small bridges.  He has good HS flexibility, just needs some cues to get core activation and to watch his posture   REHAB POTENTIAL: Good  CLINICAL DECISION MAKING: Stable/uncomplicated  EVALUATION COMPLEXITY: Low   GOALS: Goals reviewed with patient? Yes  SHORT TERM GOALS: Target date: 04/27/22  Patient will be independent with initial HEP.  Goal status: progressing   LONG TERM GOALS: Target date: 06/15/22  Patient will be independent with advanced/ongoing HEP to improve outcomes and carryover.  Goal status: INITIAL  2.   Patient will report 75% improvement in low back pain to improve QOL.  Goal status: partially met  4.  Patient will demonstrate full pain free lumbar ROM to perform ADLs.   Baseline: 9/10 Goal status:partially met  5.  Patient will demonstrate improved functional strength as demonstrated by hip abductor  and extensor strength >=4+/5. Goal status: INITIAL  6.  Patient will report 43 on lumbar FOTO to demonstrate improved functional ability.  Baseline: 69 Goal status: INITIAL     PLAN: PT FREQUENCY: 1-2x/week  PT DURATION: 8 weeks  PLANNED INTERVENTIONS: Therapeutic exercises, Therapeutic activity, Neuromuscular re-education, Balance training, Gait training, Patient/Family education, Self Care, Joint mobilization, Dry Needling, Electrical stimulation, Spinal manipulation, Spinal mobilization, Cryotherapy, Moist heat, Taping, Traction, Ionotophoresis 85m/ml Dexamethasone, and Manual therapy.  PLAN FOR NEXT SESSION: gym activities for low back, stretching    Jong Rickman W, PT 04/18/2022, 4:07 PM

## 2022-04-20 ENCOUNTER — Encounter: Payer: 59 | Admitting: Physical Medicine & Rehabilitation

## 2022-04-25 NOTE — Therapy (Signed)
OUTPATIENT PHYSICAL THERAPY THORACOLUMBAR TREATMENT   Patient Name: Jesus Gray MRN: [id number] DOB:02-25-1976, 46 y.o., male Today's Date: 04/26/2022   PT End of Session - 04/26/22 1606     Visit Number 6    Date for PT Re-Evaluation 06/15/22    PT Start Time 2947    PT Stop Time 1650    PT Time Calculation (min) 44 min    Activity Tolerance Patient tolerated treatment well    Behavior During Therapy [facility] for tasks assessed/performed               Past Medical History:  Diagnosis Date   Diabetes mellitus without complication ([place])    Hypertension    History reviewed. No pertinent surgical history. Patient Active Problem List   Diagnosis Date Noted   Type II or unspecified type diabetes mellitus without mention of complication, not stated as uncontrolled 07/19/2013    PCP: Jonathon Jordan  REFERRING PROVIDER: Alysia Penna  REFERRING DIAG: M51.26  Rationale for Evaluation and Treatment Rehabilitation  THERAPY DIAG:  Other low back pain  Muscle weakness (generalized)  Radiculopathy, lumbar region  Other lack of coordination  ONSET DATE: 03/09/22  SUBJECTIVE:                                                                                                                                                                                           SUBJECTIVE STATEMENT: My back still hurts everyday but not as bad and it is not constant anymore.    PERTINENT HISTORY:  Type II DM, HTN  PAIN:  Are you having pain? Yes: NPRS scale: 4/10 Pain location: low back more towards right side Pain description: burning, achy Aggravating factors: sitting for too long Relieving factors: pain medicine   PRECAUTIONS: None  WEIGHT BEARING RESTRICTIONS No  FALLS:  Has patient fallen in last 6 months? No  LIVING ENVIRONMENT: Lives with: lives with their family Lives in: House/apartment Stairs: Yes: Internal: 14 steps; on left going up Has following  equipment at home: None  OCCUPATION: truck driver   PLOF: Independent  PATIENT GOALS get rid of pain   OBJECTIVE:   DIAGNOSTIC FINDINGS:  FINDINGS: Lumbar vertebral body height are preserved without evidence of fracture. Minimal grade 1 anterolisthesis of L4 on L5. No spondylolysis identified. Intervertebral disc spaces appear preserved.   IMPRESSION: No acute osseous abnormality identified.  FINDINGS: The sacroiliac joint spaces are maintained and there is no evidence of arthropathy. No other bone abnormalities are seen.   IMPRESSION: Negative.   PATIENT SURVEYS:  FOTO 69/100  SCREENING FOR RED FLAGS: Bowel or bladder incontinence: No Spinal tumors: No  Cauda equina syndrome: No Compression fracture: No Abdominal aneurysm: No  COGNITION:  Overall cognitive status: Within functional limits for tasks assessed     SENSATION: WFL  MUSCLE LENGTH: Hamstrings: tightness on both side, more on R. Positive Faber--tightness in IR/ER and piriformis   POSTURE: rounded shoulders, forward head, decreased lumbar lordosis, and flexed trunk   PALPATION: TTP on L1-L5, patient has a jump reaction with light pressure on L3-L4   LUMBAR ROM:   Active  A/PROM  eval  Flexion Full but pain on R side  Extension Limited w/pain on R side  Right lateral flexion WNL  Left lateral flexion WNL  Right rotation Limited by pain  Left rotation Limited by pain   (Blank rows = not tested)  LOWER EXTREMITY ROM:  WFL  LOWER EXTREMITY MMT:  grossly 4+/5 bilaterally but pain with all testing on R side. 4/5 hip abductors and R side hip ext 3+/5  LUMBAR SPECIAL TESTS:  Straight leg raise test: Positive, Slump test: Negative, and FABER test: Positive  FUNCTIONAL TESTS:  5 times sit to stand: 17.51s was painful Timed up and go (TUG): 10.22s  GAIT: Distance walked: in clinic distances  Assistive device utilized: None Level of assistance: Complete Independence Comments: slowed gait,  antalgic gait, trendelenberg    TODAY'S TREATMENT  04/26/22 Nustep L5 x20mns  STS w/press out blue ball 2x10  Rows 20# 2x10  Lats 35# 2x10  Resisted gait 50# 4 way x5 each Traction to lumbar spine 100lbs 147ms   04/18/22 Nustep level 5 x 6 minutes 10# straight arm pulls 5# hip extension and abduction 15# palloff press 35# HS curls Leg extension 10# 2x10 35# Lats 20# farmer carry Feet on ball K2C, trunk rotation, small bridges and isometric abs  04/12/22 Nustep L5 x6m82m  Prone on elbows 2x10, 5s hold  Prone push ups arms straight 2x10, 5s hold Cobra stretch 30s  Prone supermans 2x10 Birddogs 2x10  Pallof press 20# 2x10  blackTB crunches 2x10 BlackTB ext 2x10    04/10/22 NuStep L5 x x6 min S2S OHP yellow ball 2x10 AR press 20lb x10 each  Hamstring curls 35lb 2x10  Leg Ext 10lb 2x10  Shoulder Ext 10lb 2x10 Rows 15lb 2x10 LE on pball Bridges, K2C, Oblq Prone on elbows 4x 10''  Prone opposites x5 each  04/05/22 NuStep L5 x 6 min  Rows & Lats 25lb 2x10 Lumbar Ext black 2x10 Shoulder Ext 10lb 2x10 S2S 2x10 Supine bridges x10  LE stretches piriformis, HS, ITB Lumbar stretches single and double K2C   Eval and POC    PATIENT EDUCATION:  Education details: POC Person educated: Patient Education method: Explanation Education comprehension: verbalized understanding   HOME EXERCISE PROGRAM: SK2C bilat 30s Trunk rotations  Bridges   ASSESSMENT:  CLINICAL IMPRESSION: Patient seems to be improving slowly based off subjective report. We continued with LE and back strengthening today. Also tried traction for the first time to see if it could provide some relief. He responded to it well, could possible go higher next time in terms of weight.   REHAB POTENTIAL: Good  CLINICAL DECISION MAKING: Stable/uncomplicated  EVALUATION COMPLEXITY: Low   GOALS: Goals reviewed with patient? Yes  SHORT TERM GOALS: Target date: 04/27/22  Patient will be independent  with initial HEP.  Goal status: progressing   LONG TERM GOALS: Target date: 06/15/22  Patient will be independent with advanced/ongoing HEP to improve outcomes and carryover.  Goal status: INITIAL  2.  Patient will report 75% improvement  in low back pain to improve QOL.  Goal status: partially met  4.  Patient will demonstrate full pain free lumbar ROM to perform ADLs.   Baseline: 9/10 Goal status:partially met  5.  Patient will demonstrate improved functional strength as demonstrated by hip abductor and extensor strength >=4+/5. Goal status: INITIAL  6.  Patient will report 96 on lumbar FOTO to demonstrate improved functional ability.  Baseline: 69 Goal status: INITIAL     PLAN: PT FREQUENCY: 1-2x/week  PT DURATION: 8 weeks  PLANNED INTERVENTIONS: Therapeutic exercises, Therapeutic activity, Neuromuscular re-education, Balance training, Gait training, Patient/Family education, Self Care, Joint mobilization, Dry Needling, Electrical stimulation, Spinal manipulation, Spinal mobilization, Cryotherapy, Moist heat, Taping, Traction, Ionotophoresis 27m/ml Dexamethasone, and Manual therapy.  PLAN FOR NEXT SESSION: gym activities for low back, stretching    MAndris Baumann PT 04/26/2022, 4:50 PM

## 2022-04-26 ENCOUNTER — Ambulatory Visit: Payer: 59

## 2022-04-26 DIAGNOSIS — M6281 Muscle weakness (generalized): Secondary | ICD-10-CM

## 2022-04-26 DIAGNOSIS — M5416 Radiculopathy, lumbar region: Secondary | ICD-10-CM

## 2022-04-26 DIAGNOSIS — M5459 Other low back pain: Secondary | ICD-10-CM

## 2022-04-26 DIAGNOSIS — R278 Other lack of coordination: Secondary | ICD-10-CM

## 2022-05-01 ENCOUNTER — Ambulatory Visit: Payer: 59

## 2022-05-03 ENCOUNTER — Ambulatory Visit: Payer: 59

## 2022-05-03 DIAGNOSIS — R278 Other lack of coordination: Secondary | ICD-10-CM

## 2022-05-03 DIAGNOSIS — M5459 Other low back pain: Secondary | ICD-10-CM

## 2022-05-03 DIAGNOSIS — M6281 Muscle weakness (generalized): Secondary | ICD-10-CM

## 2022-05-03 DIAGNOSIS — M5416 Radiculopathy, lumbar region: Secondary | ICD-10-CM

## 2022-05-03 NOTE — Therapy (Signed)
OUTPATIENT PHYSICAL THERAPY THORACOLUMBAR TREATMENT   Patient Name: Jesus Gray MRN: [id number] DOB:March 07, 1976, 46 y.o., male Today's Date: 05/03/2022   PT End of Session - 05/03/22 1617     Visit Number 7    Date for PT Re-Evaluation 06/15/22    PT Start Time 1615    PT Stop Time 1700    PT Time Calculation (min) 45 min    Activity Tolerance Patient tolerated treatment well    Behavior During Therapy [facility] for tasks assessed/performed                Past Medical History:  Diagnosis Date   Diabetes mellitus without complication ([place])    Hypertension    History reviewed. No pertinent surgical history. Patient Active Problem List   Diagnosis Date Noted   Type II or unspecified type diabetes mellitus without mention of complication, not stated as uncontrolled 07/19/2013    PCP: Jonathon Jordan  REFERRING PROVIDER: Alysia Penna  REFERRING DIAG: M51.26  Rationale for Evaluation and Treatment Rehabilitation  THERAPY DIAG:  Other low back pain  Muscle weakness (generalized)  Radiculopathy, lumbar region  Other lack of coordination  ONSET DATE: 03/09/22  SUBJECTIVE:                                                                                                                                                                                           SUBJECTIVE STATEMENT: Having some light pain. Traction helped, I did not have pain for 3 days until I started doing some stuff.    PERTINENT HISTORY:  Type II DM, HTN  PAIN:  Are you having pain? Yes: NPRS scale: 4/10 Pain location: low back more towards right side Pain description: burning, achy Aggravating factors: sitting for too long Relieving factors: pain medicine   PRECAUTIONS: None  WEIGHT BEARING RESTRICTIONS No  FALLS:  Has patient fallen in last 6 months? No  LIVING ENVIRONMENT: Lives with: lives with their family Lives in: House/apartment Stairs: Yes: Internal: 14 steps; on left  going up Has following equipment at home: None  OCCUPATION: truck driver   PLOF: Independent  PATIENT GOALS get rid of pain   OBJECTIVE:   DIAGNOSTIC FINDINGS:  FINDINGS: Lumbar vertebral body height are preserved without evidence of fracture. Minimal grade 1 anterolisthesis of L4 on L5. No spondylolysis identified. Intervertebral disc spaces appear preserved.   IMPRESSION: No acute osseous abnormality identified.  FINDINGS: The sacroiliac joint spaces are maintained and there is no evidence of arthropathy. No other bone abnormalities are seen.   IMPRESSION: Negative.   PATIENT SURVEYS:  FOTO 69/100  SCREENING FOR RED FLAGS: Bowel or  bladder incontinence: No Spinal tumors: No Cauda equina syndrome: No Compression fracture: No Abdominal aneurysm: No  COGNITION:  Overall cognitive status: Within functional limits for tasks assessed     SENSATION: WFL  MUSCLE LENGTH: Hamstrings: tightness on both side, more on R. Positive Faber--tightness in IR/ER and piriformis   POSTURE: rounded shoulders, forward head, decreased lumbar lordosis, and flexed trunk   PALPATION: TTP on L1-L5, patient has a jump reaction with light pressure on L3-L4   LUMBAR ROM:   Active  A/PROM  eval  Flexion Full but pain on R side  Extension Limited w/pain on R side  Right lateral flexion WNL  Left lateral flexion WNL  Right rotation Limited by pain  Left rotation Limited by pain   (Blank rows = not tested)  LOWER EXTREMITY ROM:  WFL  LOWER EXTREMITY MMT:  grossly 4+/5 bilaterally but pain with all testing on R side. 4/5 hip abductors and R side hip ext 3+/5  LUMBAR SPECIAL TESTS:  Straight leg raise test: Positive, Slump test: Negative, and FABER test: Positive  FUNCTIONAL TESTS:  5 times sit to stand: 17.51s was painful Timed up and go (TUG): 10.22s  GAIT: Distance walked: in clinic distances  Assistive device utilized: None Level of assistance: Complete  Independence Comments: slowed gait, antalgic gait, trendelenberg    TODAY'S TREATMENT  05/01/22 Nustep L5x46mns  Pallof press 25# 2x10 HS curls 35# 2x10  Leg ext 20# 2x10 Leg press 60# 2x10  Farmer's carry 20# 2 laps each hand Lumbar traction 110# 10 mins   04/26/22 Nustep L5 x664ms  STS w/press out blue ball 2x10  Rows 20# 2x10  Lats 35# 2x10  Resisted gait 50# 4 way x5 each Traction to lumbar spine 100lbs 1028m   04/18/22 Nustep level 5 x 6 minutes 10# straight arm pulls 5# hip extension and abduction 15# palloff press 35# HS curls Leg extension 10# 2x10 35# Lats 20# farmer carry Feet on ball K2C, trunk rotation, small bridges and isometric abs  04/12/22 Nustep L5 x6mi39m Prone on elbows 2x10, 5s hold  Prone push ups arms straight 2x10, 5s hold Cobra stretch 30s  Prone supermans 2x10 Birddogs 2x10  Pallof press 20# 2x10  blackTB crunches 2x10 BlackTB ext 2x10    04/10/22 NuStep L5 x x6 min S2S OHP yellow ball 2x10 AR press 20lb x10 each  Hamstring curls 35lb 2x10  Leg Ext 10lb 2x10  Shoulder Ext 10lb 2x10 Rows 15lb 2x10 LE on pball Bridges, K2C, Oblq Prone on elbows 4x 10''  Prone opposites x5 each  04/05/22 NuStep L5 x 6 min  Rows & Lats 25lb 2x10 Lumbar Ext black 2x10 Shoulder Ext 10lb 2x10 S2S 2x10 Supine bridges x10  LE stretches piriformis, HS, ITB Lumbar stretches single and double K2C   Eval and POC    PATIENT EDUCATION:  Education details: POC Person educated: Patient Education method: Explanation Education comprehension: verbalized understanding   HOME EXERCISE PROGRAM: SK2C bilat 30s Trunk rotations  Bridges   ASSESSMENT:  CLINICAL IMPRESSION: Patient reports relief with lumbar traction, and did not have pain for up to 3 days. We worked on some LE strengthening today and ended with traction again.   REHAB POTENTIAL: Good  CLINICAL DECISION MAKING: Stable/uncomplicated  EVALUATION COMPLEXITY: Low   GOALS: Goals  reviewed with patient? Yes  SHORT TERM GOALS: Target date: 04/27/22  Patient will be independent with initial HEP.  Goal status: progressing   LONG TERM GOALS: Target date: 06/15/22  Patient will  be independent with advanced/ongoing HEP to improve outcomes and carryover.  Goal status: INITIAL  2.  Patient will report 75% improvement in low back pain to improve QOL.  Goal status: partially met  4.  Patient will demonstrate full pain free lumbar ROM to perform ADLs.   Baseline: 9/10 Goal status:partially met  5.  Patient will demonstrate improved functional strength as demonstrated by hip abductor and extensor strength >=4+/5. Goal status: INITIAL  6.  Patient will report 52 on lumbar FOTO to demonstrate improved functional ability.  Baseline: 69 Goal status: INITIAL     PLAN: PT FREQUENCY: 1-2x/week  PT DURATION: 8 weeks  PLANNED INTERVENTIONS: Therapeutic exercises, Therapeutic activity, Neuromuscular re-education, Balance training, Gait training, Patient/Family education, Self Care, Joint mobilization, Dry Needling, Electrical stimulation, Spinal manipulation, Spinal mobilization, Cryotherapy, Moist heat, Taping, Traction, Ionotophoresis 80m/ml Dexamethasone, and Manual therapy.  PLAN FOR NEXT SESSION: gym activities for low back, stretching    MAndris Baumann PT 05/03/2022, 4:58 PM

## 2022-05-08 ENCOUNTER — Ambulatory Visit: Payer: 59

## 2022-05-08 DIAGNOSIS — M5416 Radiculopathy, lumbar region: Secondary | ICD-10-CM

## 2022-05-08 DIAGNOSIS — M6281 Muscle weakness (generalized): Secondary | ICD-10-CM

## 2022-05-08 DIAGNOSIS — M5459 Other low back pain: Secondary | ICD-10-CM

## 2022-05-08 DIAGNOSIS — R278 Other lack of coordination: Secondary | ICD-10-CM

## 2022-05-08 NOTE — Therapy (Signed)
OUTPATIENT PHYSICAL THERAPY THORACOLUMBAR TREATMENT   Patient Name: Jesus Gray MRN: [id number] DOB:07-Jan-1976, 46 y.o., male Today's Date: 05/08/2022   PT End of Session - 05/08/22 1618     Visit Number 8    Date for PT Re-Evaluation 06/15/22    PT Start Time 1617    PT Stop Time 1700    PT Time Calculation (min) 43 min    Activity Tolerance Patient tolerated treatment well    Behavior During Therapy [facility] for tasks assessed/performed                Past Medical History:  Diagnosis Date   Diabetes mellitus without complication ([place])    Hypertension    History reviewed. No pertinent surgical history. Patient Active Problem List   Diagnosis Date Noted   Type II or unspecified type diabetes mellitus without mention of complication, not stated as uncontrolled 07/19/2013    PCP: Jonathon Jordan  REFERRING PROVIDER: Alysia Penna  REFERRING DIAG: M51.26  Rationale for Evaluation and Treatment Rehabilitation  THERAPY DIAG:  Other low back pain  Muscle weakness (generalized)  Radiculopathy, lumbar region  Other lack of coordination  ONSET DATE: 03/09/22  SUBJECTIVE:                                                                                                                                                                                           SUBJECTIVE STATEMENT: Not having pain right now but at work it got up to a 4. The traction helps because I can go about 2-3 days without pain before it comes back, but it is not as intense.    PERTINENT HISTORY:  Type II DM, HTN  PAIN:  Are you having pain? Yes: NPRS scale: 4/10 Pain location: low back more towards right side Pain description: burning, achy Aggravating factors: sitting for too long Relieving factors: pain medicine   PRECAUTIONS: None  WEIGHT BEARING RESTRICTIONS No  FALLS:  Has patient fallen in last 6 months? No  LIVING ENVIRONMENT: Lives with: lives with their family Lives  in: House/apartment Stairs: Yes: Internal: 14 steps; on left going up Has following equipment at home: None  OCCUPATION: truck driver   PLOF: Independent  PATIENT GOALS get rid of pain   OBJECTIVE:   DIAGNOSTIC FINDINGS:  FINDINGS: Lumbar vertebral body height are preserved without evidence of fracture. Minimal grade 1 anterolisthesis of L4 on L5. No spondylolysis identified. Intervertebral disc spaces appear preserved.   IMPRESSION: No acute osseous abnormality identified.  FINDINGS: The sacroiliac joint spaces are maintained and there is no evidence of arthropathy. No other bone abnormalities are seen.  IMPRESSION: Negative.   PATIENT SURVEYS:  FOTO 69/100  SCREENING FOR RED FLAGS: Bowel or bladder incontinence: No Spinal tumors: No Cauda equina syndrome: No Compression fracture: No Abdominal aneurysm: No  COGNITION:  Overall cognitive status: Within functional limits for tasks assessed     SENSATION: WFL  MUSCLE LENGTH: Hamstrings: tightness on both side, more on R. Positive Faber--tightness in IR/ER and piriformis   POSTURE: rounded shoulders, forward head, decreased lumbar lordosis, and flexed trunk   PALPATION: TTP on L1-L5, patient has a jump reaction with light pressure on L3-L4   LUMBAR ROM:   Active  A/PROM  eval  Flexion Full but pain on R side  Extension Limited w/pain on R side  Right lateral flexion WNL  Left lateral flexion WNL  Right rotation Limited by pain  Left rotation Limited by pain   (Blank rows = not tested)  LOWER EXTREMITY ROM:  WFL  LOWER EXTREMITY MMT:  grossly 4+/5 bilaterally but pain with all testing on R side. 4/5 hip abductors and R side hip ext 3+/5  LUMBAR SPECIAL TESTS:  Straight leg raise test: Positive, Slump test: Negative, and FABER test: Positive  FUNCTIONAL TESTS:  5 times sit to stand: 17.51s was painful Timed up and go (TUG): 10.22s  GAIT: Distance walked: in clinic distances  Assistive device  utilized: None Level of assistance: Complete Independence Comments: slowed gait, antalgic gait, trendelenberg    TODAY'S TREATMENT  05/08/22 Nustep L6 x76mns  5xSTS- 12.16s Shoulder ext 15# 2x10 Cable rows 15# 2x10 Supermans 2x10  HS stretch 30s bilat  Feet on ball bridges, rotation and bridge, bridge and HS curl x10 each  Rows and lats 25# 2x10 blackTB ext 2x10    05/01/22 Nustep L5x662ms  Pallof press 25# 2x10 HS curls 35# 2x10  Leg ext 20# 2x10 Leg press 60# 2x10  Farmer's carry 20# 2 laps each hand Lumbar traction 110# 10 mins   04/26/22 Nustep L5 x6m50m  STS w/press out blue ball 2x10  Rows 20# 2x10  Lats 35# 2x10  Resisted gait 50# 4 way x5 each Traction to lumbar spine 100lbs 39m39m  04/18/22 Nustep level 5 x 6 minutes 10# straight arm pulls 5# hip extension and abduction 15# palloff press 35# HS curls Leg extension 10# 2x10 35# Lats 20# farmer carry Feet on ball K2C, trunk rotation, small bridges and isometric abs  04/12/22 Nustep L5 x6min42mProne on elbows 2x10, 5s hold  Prone push ups arms straight 2x10, 5s hold Cobra stretch 30s  Prone supermans 2x10 Birddogs 2x10  Pallof press 20# 2x10  blackTB crunches 2x10 BlackTB ext 2x10    04/10/22 NuStep L5 x x6 min S2S OHP yellow ball 2x10 AR press 20lb x10 each  Hamstring curls 35lb 2x10  Leg Ext 10lb 2x10  Shoulder Ext 10lb 2x10 Rows 15lb 2x10 LE on pball Bridges, K2C, Oblq Prone on elbows 4x 10''  Prone opposites x5 each  04/05/22 NuStep L5 x 6 min  Rows & Lats 25lb 2x10 Lumbar Ext black 2x10 Shoulder Ext 10lb 2x10 S2S 2x10 Supine bridges x10  LE stretches piriformis, HS, ITB Lumbar stretches single and double K2C   Eval and POC    PATIENT EDUCATION:  Education details: POC Person educated: Patient Education method: Explanation Education comprehension: verbalized understanding   HOME EXERCISE PROGRAM: SK2C bilat 30s Trunk rotations  Bridges   ASSESSMENT:  CLINICAL  IMPRESSION: Patient reports some pain today during his work day, but is okay at beginning of session.  Continues to have relief with lumbar traction for a few days, we worked on strengthening today and will do traction again next visit.   REHAB POTENTIAL: Good  CLINICAL DECISION MAKING: Stable/uncomplicated  EVALUATION COMPLEXITY: Low   GOALS: Goals reviewed with patient? Yes  SHORT TERM GOALS: Target date: 04/27/22  Patient will be independent with initial HEP.  Goal status: progressing   LONG TERM GOALS: Target date: 06/15/22  Patient will be independent with advanced/ongoing HEP to improve outcomes and carryover.  Goal status: INITIAL  2.  Patient will report 75% improvement in low back pain to improve QOL.  Goal status: partially met  4.  Patient will demonstrate full pain free lumbar ROM to perform ADLs.   Baseline: 9/10 Goal status:partially met  5.  Patient will demonstrate improved functional strength as demonstrated by hip abductor and extensor strength >=4+/5. Goal status: INITIAL  6.  Patient will report 25 on lumbar FOTO to demonstrate improved functional ability.  Baseline: 69 Goal status: INITIAL     PLAN: PT FREQUENCY: 1-2x/week  PT DURATION: 8 weeks  PLANNED INTERVENTIONS: Therapeutic exercises, Therapeutic activity, Neuromuscular re-education, Balance training, Gait training, Patient/Family education, Self Care, Joint mobilization, Dry Needling, Electrical stimulation, Spinal manipulation, Spinal mobilization, Cryotherapy, Moist heat, Taping, Traction, Ionotophoresis 5m/ml Dexamethasone, and Manual therapy.  PLAN FOR NEXT SESSION: gym activities for low back, stretching    MAndris Baumann PT 05/08/2022, 4:58 PM

## 2022-05-10 ENCOUNTER — Ambulatory Visit: Payer: 59

## 2022-05-10 DIAGNOSIS — R278 Other lack of coordination: Secondary | ICD-10-CM

## 2022-05-10 DIAGNOSIS — M5416 Radiculopathy, lumbar region: Secondary | ICD-10-CM

## 2022-05-10 DIAGNOSIS — M6281 Muscle weakness (generalized): Secondary | ICD-10-CM

## 2022-05-10 DIAGNOSIS — M5459 Other low back pain: Secondary | ICD-10-CM

## 2022-05-10 NOTE — Therapy (Signed)
OUTPATIENT PHYSICAL THERAPY THORACOLUMBAR TREATMENT   Patient Name: Jesus Gray MRN: [id number] DOB:Oct 30, 1975, 46 y.o., male Today's Date: 05/10/2022   PT End of Session - 05/10/22 1616     Visit Number 9    Date for PT Re-Evaluation 06/15/22    PT Start Time 1615    PT Stop Time 1700    PT Time Calculation (min) 45 min    Activity Tolerance Patient tolerated treatment well    Behavior During Therapy [facility] for tasks assessed/performed                 Past Medical History:  Diagnosis Date   Diabetes mellitus without complication ([place])    Hypertension    History reviewed. No pertinent surgical history. Patient Active Problem List   Diagnosis Date Noted   Type II or unspecified type diabetes mellitus without mention of complication, not stated as uncontrolled 07/19/2013    PCP: Jonathon Jordan  REFERRING PROVIDER: Alysia Penna  REFERRING DIAG: M51.26  Rationale for Evaluation and Treatment Rehabilitation  THERAPY DIAG:  Other low back pain  Muscle weakness (generalized)  Radiculopathy, lumbar region  Other lack of coordination  ONSET DATE: 03/09/22  SUBJECTIVE:                                                                                                                                                                                           SUBJECTIVE STATEMENT: I am alright having some light pain.    PERTINENT HISTORY:  Type II DM, HTN  PAIN:  Are you having pain? Yes: NPRS scale: 4/10 Pain location: low back more towards right side Pain description: burning, achy Aggravating factors: sitting for too long Relieving factors: pain medicine   PRECAUTIONS: None  WEIGHT BEARING RESTRICTIONS No  FALLS:  Has patient fallen in last 6 months? No  LIVING ENVIRONMENT: Lives with: lives with their family Lives in: House/apartment Stairs: Yes: Internal: 14 steps; on left going up Has following equipment at home: None  OCCUPATION:  truck driver   PLOF: Independent  PATIENT GOALS get rid of pain   OBJECTIVE:   DIAGNOSTIC FINDINGS:  FINDINGS: Lumbar vertebral body height are preserved without evidence of fracture. Minimal grade 1 anterolisthesis of L4 on L5. No spondylolysis identified. Intervertebral disc spaces appear preserved.   IMPRESSION: No acute osseous abnormality identified.  FINDINGS: The sacroiliac joint spaces are maintained and there is no evidence of arthropathy. No other bone abnormalities are seen.   IMPRESSION: Negative.   PATIENT SURVEYS:  FOTO 69/100  SCREENING FOR RED FLAGS: Bowel or bladder incontinence: No Spinal tumors: No Cauda equina syndrome: No Compression fracture:  No Abdominal aneurysm: No  COGNITION:  Overall cognitive status: Within functional limits for tasks assessed     SENSATION: WFL  MUSCLE LENGTH: Hamstrings: tightness on both side, more on R. Positive Faber--tightness in IR/ER and piriformis   POSTURE: rounded shoulders, forward head, decreased lumbar lordosis, and flexed trunk   PALPATION: TTP on L1-L5, patient has a jump reaction with light pressure on L3-L4   LUMBAR ROM:   Active  A/PROM  eval  Flexion Full but pain on R side  Extension Limited w/pain on R side  Right lateral flexion WNL  Left lateral flexion WNL  Right rotation Limited by pain  Left rotation Limited by pain   (Blank rows = not tested)  LOWER EXTREMITY ROM:  WFL  LOWER EXTREMITY MMT:  grossly 4+/5 bilaterally but pain with all testing on R side. 4/5 hip abductors and R side hip ext 3+/5  LUMBAR SPECIAL TESTS:  Straight leg raise test: Positive, Slump test: Negative, and FABER test: Positive  FUNCTIONAL TESTS:  5 times sit to stand: 17.51s was painful Timed up and go (TUG): 10.22s  GAIT: Distance walked: in clinic distances  Assistive device utilized: None Level of assistance: Complete Independence Comments: slowed gait, antalgic gait, trendelenberg     TODAY'S TREATMENT  05/10/22 NuStep L6 x77mns  Sit ups with blue ball 2x10 Cobra stretch 10s holds 2x5 Plank on elbows- 10s, 15s, 20s  Birddogs 2x10 Traction #110 10 mins   05/08/22 Nustep L6 x626ms  5xSTS- 12.16s Shoulder ext 15# 2x10 Cable rows 15# 2x10 Supermans 2x10  HS stretch 30s bilat  Feet on ball bridges, rotation and bridge, bridge and HS curl x10 each  Rows and lats 25# 2x10 blackTB ext 2x10    05/01/22 Nustep L5x6m47m  Pallof press 25# 2x10 HS curls 35# 2x10  Leg ext 20# 2x10 Leg press 60# 2x10  Farmer's carry 20# 2 laps each hand Lumbar traction 110# 10 mins   04/26/22 Nustep L5 x6mi65m STS w/press out blue ball 2x10  Rows 20# 2x10  Lats 35# 2x10  Resisted gait 50# 4 way x5 each Traction to lumbar spine 100lbs 10mi43m 04/18/22 Nustep level 5 x 6 minutes 10# straight arm pulls 5# hip extension and abduction 15# palloff press 35# HS curls Leg extension 10# 2x10 35# Lats 20# farmer carry Feet on ball K2C, trunk rotation, small bridges and isometric abs  04/12/22 Nustep L5 x6mins4mrone on elbows 2x10, 5s hold  Prone push ups arms straight 2x10, 5s hold Cobra stretch 30s  Prone supermans 2x10 Birddogs 2x10  Pallof press 20# 2x10  blackTB crunches 2x10 BlackTB ext 2x10    04/10/22 NuStep L5 x x6 min S2S OHP yellow ball 2x10 AR press 20lb x10 each  Hamstring curls 35lb 2x10  Leg Ext 10lb 2x10  Shoulder Ext 10lb 2x10 Rows 15lb 2x10 LE on pball Bridges, K2C, Oblq Prone on elbows 4x 10''  Prone opposites x5 each  04/05/22 NuStep L5 x 6 min  Rows & Lats 25lb 2x10 Lumbar Ext black 2x10 Shoulder Ext 10lb 2x10 S2S 2x10 Supine bridges x10  LE stretches piriformis, HS, ITB Lumbar stretches single and double K2C   Eval and POC    PATIENT EDUCATION:  Education details: POC Person educated: Patient Education method: Explanation Education comprehension: verbalized understanding   HOME EXERCISE PROGRAM: SK2C bilat 30s Trunk  rotations  Bridges   ASSESSMENT:  CLINICAL IMPRESSION: Patient returns with "light pain" in low back. We worked on some core  exercises today to provide some stability to low back. Weakness noted with core exercises. Ended with traction as patient has stated that it provides him relief for a few days and will help him into the weekend.   REHAB POTENTIAL: Good  CLINICAL DECISION MAKING: Stable/uncomplicated  EVALUATION COMPLEXITY: Low   GOALS: Goals reviewed with patient? Yes  SHORT TERM GOALS: Target date: 04/27/22  Patient will be independent with initial HEP.  Goal status: progressing   LONG TERM GOALS: Target date: 06/15/22  Patient will be independent with advanced/ongoing HEP to improve outcomes and carryover.  Goal status: INITIAL  2.  Patient will report 75% improvement in low back pain to improve QOL.  Goal status: partially met  4.  Patient will demonstrate full pain free lumbar ROM to perform ADLs.   Baseline: 9/10 Goal status:partially met  5.  Patient will demonstrate improved functional strength as demonstrated by hip abductor and extensor strength >=4+/5. Goal status: INITIAL  6.  Patient will report 35 on lumbar FOTO to demonstrate improved functional ability.  Baseline: 69 Goal status: INITIAL     PLAN: PT FREQUENCY: 1-2x/week  PT DURATION: 8 weeks  PLANNED INTERVENTIONS: Therapeutic exercises, Therapeutic activity, Neuromuscular re-education, Balance training, Gait training, Patient/Family education, Self Care, Joint mobilization, Dry Needling, Electrical stimulation, Spinal manipulation, Spinal mobilization, Cryotherapy, Moist heat, Taping, Traction, Ionotophoresis 29m/ml Dexamethasone, and Manual therapy.  PLAN FOR NEXT SESSION: gym activities for low back, stretching    MAndris Baumann PT 05/10/2022, 4:57 PM

## 2022-05-18 ENCOUNTER — Encounter: Payer: Self-pay | Admitting: Physical Therapy

## 2022-05-18 ENCOUNTER — Ambulatory Visit: Payer: 59 | Attending: Physical Medicine & Rehabilitation | Admitting: Physical Therapy

## 2022-05-18 DIAGNOSIS — M6281 Muscle weakness (generalized): Secondary | ICD-10-CM | POA: Diagnosis present

## 2022-05-18 DIAGNOSIS — R278 Other lack of coordination: Secondary | ICD-10-CM | POA: Diagnosis present

## 2022-05-18 DIAGNOSIS — M5416 Radiculopathy, lumbar region: Secondary | ICD-10-CM | POA: Diagnosis present

## 2022-05-18 DIAGNOSIS — M5459 Other low back pain: Secondary | ICD-10-CM | POA: Insufficient documentation

## 2022-05-18 NOTE — Therapy (Signed)
OUTPATIENT PHYSICAL THERAPY THORACOLUMBAR TREATMENT   Patient Name: Jesus Gray MRN: [id number] DOB:1976-05-01, 46 y.o., male Today's Date: 05/18/2022   PT End of Session - 05/18/22 1601     Visit Number 10    Date for PT Re-Evaluation 06/15/22    PT Start Time 1600    PT Stop Time 1645    PT Time Calculation (min) 45 min    Activity Tolerance Patient tolerated treatment well    Behavior During Therapy [facility] for tasks assessed/performed                 Past Medical History:  Diagnosis Date   Diabetes mellitus without complication ([place])    Hypertension    History reviewed. No pertinent surgical history. Patient Active Problem List   Diagnosis Date Noted   Type II or unspecified type diabetes mellitus without mention of complication, not stated as uncontrolled 07/19/2013    PCP: Jonathon Jordan  REFERRING PROVIDER: Alysia Penna  REFERRING DIAG: M51.26  Rationale for Evaluation and Treatment Rehabilitation  THERAPY DIAG:  Muscle weakness (generalized)  Other low back pain  Radiculopathy, lumbar region  Other lack of coordination  ONSET DATE: 03/09/22  SUBJECTIVE:                                                                                                                                                                                           SUBJECTIVE STATEMENT: Dong good, haven't hurt since last visit   PERTINENT HISTORY:  Type II DM, HTN  PAIN:  Are you having pain? Yes: NPRS scale: 0/10 Pain location: low back more towards right side Pain description: burning, achy Aggravating factors: sitting for too long Relieving factors: pain medicine   PRECAUTIONS: None  WEIGHT BEARING RESTRICTIONS No  FALLS:  Has patient fallen in last 6 months? No  LIVING ENVIRONMENT: Lives with: lives with their family Lives in: House/apartment Stairs: Yes: Internal: 14 steps; on left going up Has following equipment at home: None  OCCUPATION:  truck driver   PLOF: Independent  PATIENT GOALS get rid of pain   OBJECTIVE:   DIAGNOSTIC FINDINGS:  FINDINGS: Lumbar vertebral body height are preserved without evidence of fracture. Minimal grade 1 anterolisthesis of L4 on L5. No spondylolysis identified. Intervertebral disc spaces appear preserved.   IMPRESSION: No acute osseous abnormality identified.  FINDINGS: The sacroiliac joint spaces are maintained and there is no evidence of arthropathy. No other bone abnormalities are seen.   IMPRESSION: Negative.   PATIENT SURVEYS:  FOTO 69/100  SCREENING FOR RED FLAGS: Bowel or bladder incontinence: No Spinal tumors: No Cauda equina syndrome: No Compression fracture: No  Abdominal aneurysm: No  COGNITION:  Overall cognitive status: Within functional limits for tasks assessed     SENSATION: WFL  MUSCLE LENGTH: Hamstrings: tightness on both side, more on R. Positive Faber--tightness in IR/ER and piriformis   POSTURE: rounded shoulders, forward head, decreased lumbar lordosis, and flexed trunk   PALPATION: TTP on L1-L5, patient has a jump reaction with light pressure on L3-L4   LUMBAR ROM:   Active  A/PROM  eval  Flexion Full but pain on R side  Extension Limited w/pain on R side  Right lateral flexion WNL  Left lateral flexion WNL  Right rotation Limited by pain  Left rotation Limited by pain   (Blank rows = not tested)  LOWER EXTREMITY ROM:  WFL  LOWER EXTREMITY MMT:  grossly 4+/5 bilaterally but pain with all testing on R side. 4/5 hip abductors and R side hip ext 3+/5  LUMBAR SPECIAL TESTS:  Straight leg raise test: Positive, Slump test: Negative, and FABER test: Positive  FUNCTIONAL TESTS:  5 times sit to stand: 17.51s was painful Timed up and go (TUG): 10.22s  GAIT: Distance walked: in clinic distances  Assistive device utilized: None Level of assistance: Complete Independence Comments: slowed gait, antalgic gait, trendelenberg     TODAY'S TREATMENT  05/18/22 NuStep L5 x 6 min S2S OHP blue ball 2x10 Bird dog opposite  Plank on elbows- 10s, 15s, 20s  Deadbugs x5 weakness.  Hamstring curls 45lb 2x10 Leg Ext 2x10  Traction #110 10 mins    05/10/22 NuStep L6 x73mns  Sit ups with blue ball 2x10 Cobra stretch 10s holds 2x5 Plank on elbows- 10s, 15s, 20s  Birddogs 2x10 Traction #110 10 mins   05/08/22 Nustep L6 x640ms  5xSTS- 12.16s Shoulder ext 15# 2x10 Cable rows 15# 2x10 Supermans 2x10  HS stretch 30s bilat  Feet on ball bridges, rotation and bridge, bridge and HS curl x10 each  Rows and lats 25# 2x10 blackTB ext 2x10    05/01/22 Nustep L5x6m54m  Pallof press 25# 2x10 HS curls 35# 2x10  Leg ext 20# 2x10 Leg press 60# 2x10  Farmer's carry 20# 2 laps each hand Lumbar traction 110# 10 mins      PATIENT EDUCATION:  Education details: POC Person educated: Patient Education method: Explanation Education comprehension: verbalized understanding   HOME EXERCISE PROGRAM: SK2C bilat 30s Trunk rotations  Bridges   ASSESSMENT:  CLINICAL IMPRESSION: Patient returns with no pain in low back. We worked on some core exercises today to provide some stability to low back. Weakness noted with plank and dead bug exercises. Ended with traction due to pt subjective reports of it helping   REHAB POTENTIAL: Good  CLINICAL DECISION MAKING: Stable/uncomplicated  EVALUATION COMPLEXITY: Low   GOALS: Goals reviewed with patient? Yes  SHORT TERM GOALS: Target date: 04/27/22  Patient will be independent with initial HEP.  Goal status: progressing   LONG TERM GOALS: Target date: 06/15/22  Patient will be independent with advanced/ongoing HEP to improve outcomes and carryover.  Goal status: INITIAL  2.  Patient will report 75% improvement in low back pain to improve QOL.  Goal status: Met 05/18/22  4.  Patient will demonstrate full pain free lumbar ROM to perform ADLs.   Baseline: 9/10 Goal  status:partially met  5.  Patient will demonstrate improved functional strength as demonstrated by hip abductor and extensor strength >=4+/5. Goal status: INITIAL  6.  Patient will report 77 51 lumbar FOTO to demonstrate improved functional ability.  Baseline: 69 Goal  status: Met     PLAN: PT FREQUENCY: 1-2x/week  PT DURATION: 8 weeks  PLANNED INTERVENTIONS: Therapeutic exercises, Therapeutic activity, Neuromuscular re-education, Balance training, Gait training, Patient/Family education, Self Care, Joint mobilization, Dry Needling, Electrical stimulation, Spinal manipulation, Spinal mobilization, Cryotherapy, Moist heat, Taping, Traction, Ionotophoresis 23m/ml Dexamethasone, and Manual therapy.  PLAN FOR NEXT SESSION: gym activities for low back, stretching    RScot Jun PTA 05/18/2022, 4:02 PM

## 2022-05-22 ENCOUNTER — Ambulatory Visit: Payer: 59 | Admitting: Physical Therapy

## 2022-05-24 ENCOUNTER — Ambulatory Visit: Payer: 59 | Admitting: Physical Therapy

## 2022-05-24 ENCOUNTER — Encounter: Payer: Self-pay | Admitting: Physical Therapy

## 2022-05-24 DIAGNOSIS — M5459 Other low back pain: Secondary | ICD-10-CM

## 2022-05-24 DIAGNOSIS — M6281 Muscle weakness (generalized): Secondary | ICD-10-CM

## 2022-05-24 NOTE — Therapy (Signed)
OUTPATIENT PHYSICAL THERAPY THORACOLUMBAR TREATMENT   Patient Name: Jesus Gray MRN: [id number] DOB:1976-06-22, 46 y.o., male Today's Date: 05/24/2022   PT End of Session - 05/24/22 1603     Visit Number 11    Date for PT Re-Evaluation 06/15/22    PT Start Time 1600    PT Stop Time 1645    PT Time Calculation (min) 45 min    Behavior During Therapy [facility] for tasks assessed/performed                 Past Medical History:  Diagnosis Date   Diabetes mellitus without complication ([place])    Hypertension    History reviewed. No pertinent surgical history. Patient Active Problem List   Diagnosis Date Noted   Type II or unspecified type diabetes mellitus without mention of complication, not stated as uncontrolled 07/19/2013    PCP: Jonathon Jordan  REFERRING PROVIDER: Alysia Penna  REFERRING DIAG: M51.26  Rationale for Evaluation and Treatment Rehabilitation  THERAPY DIAG:  Muscle weakness (generalized)  Other low back pain  ONSET DATE: 03/09/22  SUBJECTIVE:                                                                                                                                                                                           SUBJECTIVE STATEMENT: Doing good   PERTINENT HISTORY:  Type II DM, HTN  PAIN:  Are you having pain? Yes: NPRS scale: 0/10 Pain location: low back more towards right side Pain description: burning, achy Aggravating factors: sitting for too long Relieving factors: pain medicine   PRECAUTIONS: None  WEIGHT BEARING RESTRICTIONS No  FALLS:  Has patient fallen in last 6 months? No  LIVING ENVIRONMENT: Lives with: lives with their family Lives in: House/apartment Stairs: Yes: Internal: 14 steps; on left going up Has following equipment at home: None  OCCUPATION: truck driver   PLOF: Independent  PATIENT GOALS get rid of pain   OBJECTIVE:   DIAGNOSTIC FINDINGS:  FINDINGS: Lumbar vertebral body  height are preserved without evidence of fracture. Minimal grade 1 anterolisthesis of L4 on L5. No spondylolysis identified. Intervertebral disc spaces appear preserved.   IMPRESSION: No acute osseous abnormality identified.  FINDINGS: The sacroiliac joint spaces are maintained and there is no evidence of arthropathy. No other bone abnormalities are seen.   IMPRESSION: Negative.   PATIENT SURVEYS:  FOTO 69/100  SCREENING FOR RED FLAGS: Bowel or bladder incontinence: No Spinal tumors: No Cauda equina syndrome: No Compression fracture: No Abdominal aneurysm: No  COGNITION:  Overall cognitive status: Within functional limits for tasks assessed     SENSATION: [facility]  MUSCLE  LENGTH: Hamstrings: tightness on both side, more on R. Positive Faber--tightness in IR/ER and piriformis   POSTURE: rounded shoulders, forward head, decreased lumbar lordosis, and flexed trunk   PALPATION: TTP on L1-L5, patient has a jump reaction with light pressure on L3-L4   LUMBAR ROM:   Active  A/PROM  eval  Flexion Full but pain on R side  Extension Limited w/pain on R side  Right lateral flexion WNL  Left lateral flexion WNL  Right rotation Limited by pain  Left rotation Limited by pain   (Blank rows = not tested)  LOWER EXTREMITY ROM:  WFL  LOWER EXTREMITY MMT:  grossly 4+/5 bilaterally but pain with all testing on R side. 4/5 hip abductors and R side hip ext 3+/5  LUMBAR SPECIAL TESTS:  Straight leg raise test: Positive, Slump test: Negative, and FABER test: Positive  FUNCTIONAL TESTS:  5 times sit to stand: 17.51s was painful Timed up and go (TUG): 10.22s  GAIT: Distance walked: in clinic distances  Assistive device utilized: None Level of assistance: Complete Independence Comments: slowed gait, antalgic gait, trendelenberg    TODAY'S TREATMENT  05/24/22 NuStep L5 x 6 min Rows & Lats 45lb 2x10 Shoulder Ext 10lb 2x10 AR press 20lb x10 each S2S OHP blue ball  2x10 Traction #110 10 mins    05/18/22 NuStep L5 x 6 min S2S OHP blue ball 2x10 Bird dog opposite  Plank on elbows- 10s, 15s, 20s  Deadbugs x5 weakness.  Hamstring curls 45lb 2x10 Leg Ext 2x10  Traction #110 10 mins    05/10/22 NuStep L6 x59mns  Sit ups with blue ball 2x10 Cobra stretch 10s holds 2x5 Plank on elbows- 10s, 15s, 20s  Birddogs 2x10 Traction #110 10 mins   05/08/22 Nustep L6 x669ms  5xSTS- 12.16s Shoulder ext 15# 2x10 Cable rows 15# 2x10 Supermans 2x10  HS stretch 30s bilat  Feet on ball bridges, rotation and bridge, bridge and HS curl x10 each  Rows and lats 25# 2x10 blackTB ext 2x10    05/01/22 Nustep L5x6m80m  Pallof press 25# 2x10 HS curls 35# 2x10  Leg ext 20# 2x10 Leg press 60# 2x10  Farmer's carry 20# 2 laps each hand Lumbar traction 110# 10 mins      PATIENT EDUCATION:  Education details: POC Person educated: Patient Education method: Explanation Education comprehension: verbalized understanding   HOME EXERCISE PROGRAM: SK2C bilat 30s Trunk rotations  Bridges   ASSESSMENT:  CLINICAL IMPRESSION: Again Patient enters with no pain in low back. We worked on some postural exercises today to provide some stability to low back. Core weakness present with anti rotational presses. Cue to prevent scapular protraction needed with rows. Ended with traction due to pt subjective reports of it helping   REHAB POTENTIAL: Good  CLINICAL DECISION MAKING: Stable/uncomplicated  EVALUATION COMPLEXITY: Low   GOALS: Goals reviewed with patient? Yes  SHORT TERM GOALS: Target date: 04/27/22  Patient will be independent with initial HEP.  Goal status: progressing   LONG TERM GOALS: Target date: 06/15/22  Patient will be independent with advanced/ongoing HEP to improve outcomes and carryover.  Goal status: INITIAL  2.  Patient will report 75% improvement in low back pain to improve QOL.  Goal status: Met 05/18/22  4.  Patient will demonstrate  full pain free lumbar ROM to perform ADLs.   Baseline: 9/10 Goal status:partially met  5.  Patient will demonstrate improved functional strength as demonstrated by hip abductor and extensor strength >=4+/5. Goal status: INITIAL  6.  Patient will report 40 on lumbar FOTO to demonstrate improved functional ability.  Baseline: 69 Goal status: Met     PLAN: PT FREQUENCY: 1-2x/week  PT DURATION: 8 weeks  PLANNED INTERVENTIONS: Therapeutic exercises, Therapeutic activity, Neuromuscular re-education, Balance training, Gait training, Patient/Family education, Self Care, Joint mobilization, Dry Needling, Electrical stimulation, Spinal manipulation, Spinal mobilization, Cryotherapy, Moist heat, Taping, Traction, Ionotophoresis 59m/ml Dexamethasone, and Manual therapy.  PLAN FOR NEXT SESSION: gym activities for low back, stretching    RScot Jun PTA 05/24/2022, 4:08 PM

## 2022-05-29 ENCOUNTER — Ambulatory Visit: Payer: 59 | Admitting: Physical Therapy

## 2022-05-31 ENCOUNTER — Ambulatory Visit: Payer: 59 | Admitting: Physical Therapy

## 2022-05-31 ENCOUNTER — Encounter: Payer: Self-pay | Admitting: Physical Therapy

## 2022-05-31 DIAGNOSIS — M6281 Muscle weakness (generalized): Secondary | ICD-10-CM | POA: Diagnosis not present

## 2022-05-31 DIAGNOSIS — M5416 Radiculopathy, lumbar region: Secondary | ICD-10-CM

## 2022-05-31 DIAGNOSIS — M5459 Other low back pain: Secondary | ICD-10-CM

## 2022-05-31 NOTE — Therapy (Signed)
OUTPATIENT PHYSICAL THERAPY THORACOLUMBAR TREATMENT   Patient Name: Jesus Gray MRN: [id number] DOB:10-05-1975, 46 y.o., male Today's Date: 05/31/2022   PT End of Session - 05/31/22 1602     Visit Number 12    Date for PT Re-Evaluation 06/15/22    PT Start Time 1603    PT Stop Time 1645    PT Time Calculation (min) 42 min    Activity Tolerance Patient tolerated treatment well    Behavior During Therapy [facility] for tasks assessed/performed                 Past Medical History:  Diagnosis Date   Diabetes mellitus without complication ([place])    Hypertension    History reviewed. No pertinent surgical history. Patient Active Problem List   Diagnosis Date Noted   Type II or unspecified type diabetes mellitus without mention of complication, not stated as uncontrolled 07/19/2013    PCP: Jonathon Jordan  REFERRING PROVIDER: Alysia Penna  REFERRING DIAG: M51.26  Rationale for Evaluation and Treatment Rehabilitation  THERAPY DIAG:  Muscle weakness (generalized)  Other low back pain  Radiculopathy, lumbar region  ONSET DATE: 03/09/22  SUBJECTIVE:                                                                                                                                                                                           SUBJECTIVE STATEMENT: Doing good, no pain in the last couple week   PERTINENT HISTORY:  Type II DM, HTN  PAIN:  Are you having pain? Yes: NPRS scale: 0/10 Pain location: low back more towards right side Pain description: burning, achy Aggravating factors: sitting for too long Relieving factors: pain medicine   PRECAUTIONS: None  WEIGHT BEARING RESTRICTIONS No  FALLS:  Has patient fallen in last 6 months? No  LIVING ENVIRONMENT: Lives with: lives with their family Lives in: House/apartment Stairs: Yes: Internal: 14 steps; on left going up Has following equipment at home: None  OCCUPATION: truck driver   PLOF:  Independent  PATIENT GOALS get rid of pain   OBJECTIVE:   DIAGNOSTIC FINDINGS:  FINDINGS: Lumbar vertebral body height are preserved without evidence of fracture. Minimal grade 1 anterolisthesis of L4 on L5. No spondylolysis identified. Intervertebral disc spaces appear preserved.   IMPRESSION: No acute osseous abnormality identified.  FINDINGS: The sacroiliac joint spaces are maintained and there is no evidence of arthropathy. No other bone abnormalities are seen.   IMPRESSION: Negative.   PATIENT SURVEYS:  FOTO 69/100  SCREENING FOR RED FLAGS: Bowel or bladder incontinence: No Spinal tumors: No Cauda equina syndrome: No Compression fracture: No Abdominal aneurysm: No  COGNITION:  Overall cognitive status: Within functional limits for tasks assessed      MUSCLE LENGTH: Hamstrings: tightness on both side, more on R. Positive Faber--tightness in IR/ER and piriformis   POSTURE: rounded shoulders, forward head, decreased lumbar lordosis, and flexed trunk   PALPATION: TTP on L1-L5, patient has a jump reaction with light pressure on L3-L4   LUMBAR ROM:   Active  A/PROM  eval  Flexion Full but pain on R side  Extension Limited w/pain on R side  Right lateral flexion WNL  Left lateral flexion WNL  Right rotation Limited by pain  Left rotation Limited by pain   (Blank rows = not tested)  LOWER EXTREMITY ROM:  WFL  LOWER EXTREMITY MMT:  grossly 4+/5 bilaterally but pain with all testing on R side. 4/5 hip abductors and R side hip ext 3+/5  LUMBAR SPECIAL TESTS:  Straight leg raise test: Positive, Slump test: Negative, and FABER test: Positive  FUNCTIONAL TESTS:  5 times sit to stand: 17.51s was painful Timed up and go (TUG): 10.22s  GAIT: Distance walked: in clinic distances  Assistive device utilized: None Level of assistance: Complete Independence Comments: slowed gait, antalgic gait, trendelenberg    TODAY'S TREATMENT  05/31/22 Hamstring curls  45lb 2x10 Leg Ext 15lb 2x10 Shoulder Ext 10lb 2x10 AR press 25lb x10 each Rows & Lats 45lb 2x10 Chest press 20lb 2x10  Traction #110 10 mins     05/24/22 NuStep L5 x 6 min Rows & Lats 45lb 2x10 Shoulder Ext 10lb 2x10 AR press 20lb x10 each S2S OHP blue ball 2x10 Traction #110 10 mins    05/18/22 NuStep L5 x 6 min S2S OHP blue ball 2x10 Bird dog opposite  Plank on elbows- 10s, 15s, 20s  Deadbugs x5 weakness.  Hamstring curls 45lb 2x10 Leg Ext 2x10  Traction #110 10 mins    05/10/22 NuStep L6 x73mns  Sit ups with blue ball 2x10 Cobra stretch 10s holds 2x5 Plank on elbows- 10s, 15s, 20s  Birddogs 2x10 Traction #110 10 mins   05/08/22 Nustep L6 x670ms  5xSTS- 12.16s Shoulder ext 15# 2x10 Cable rows 15# 2x10 Supermans 2x10  HS stretch 30s bilat  Feet on ball bridges, rotation and bridge, bridge and HS curl x10 each  Rows and lats 25# 2x10 blackTB ext 2x10    PATIENT EDUCATION:  Education details: POC Person educated: Patient Education method: Explanation Education comprehension: verbalized understanding   HOME EXERCISE PROGRAM: SK2C bilat 30s Trunk rotations  Bridges   ASSESSMENT:  CLINICAL IMPRESSION: Again Patient enters with no pain in low back. We worked on some postural exercises today to provide some stability to low back. Core weakness present with anti rotational presses. Pt reports being pleased with his current functional status. He reports bing pain free for the past few weeks.  REHAB POTENTIAL: Good  CLINICAL DECISION MAKING: Stable/uncomplicated  EVALUATION COMPLEXITY: Low   GOALS: Goals reviewed with patient? Yes  SHORT TERM GOALS: Target date: 04/27/22  Patient will be independent with initial HEP.  Goal status: Met   LONG TERM GOALS: Target date: 06/15/22  Patient will be independent with advanced/ongoing HEP to improve outcomes and carryover.  Goal status: Plans on returning to the gym  2.  Patient will report 75%  improvement in low back pain to improve QOL.  Goal status: Met 05/18/22  4.  Patient will demonstrate full pain free lumbar ROM to perform ADLs.   Baseline: 9/10 Goal status: Met  5.  Patient will  demonstrate improved functional strength as demonstrated by hip abductor and extensor strength >=4+/5. Goal status: Met 05/31/22  6.  Patient will report 62 on lumbar FOTO to demonstrate improved functional ability.  Baseline: 69 Goal status: Met     PLAN: PT FREQUENCY: 1-2x/week  PT DURATION: 8 weeks  PLANNED INTERVENTIONS: Therapeutic exercises, Therapeutic activity, Neuromuscular re-education, Balance training, Gait training, Patient/Family education, Self Care, Joint mobilization, Dry Needling, Electrical stimulation, Spinal manipulation, Spinal mobilization, Cryotherapy, Moist heat, Taping, Traction, Ionotophoresis 2m/ml Dexamethasone, and Manual therapy.  PLAN FOR NEXT SESSION: gym activities for low back, stretching   PHYSICAL THERAPY DISCHARGE SUMMARY  Visits from Start of Care: 12    Patient agrees to discharge. Patient goals were met. Patient is being discharged due to being pleased with the current functional level.   RScot Jun PTA 05/31/2022, 4:03 PM

## 2022-06-01 ENCOUNTER — Ambulatory Visit: Payer: 59 | Admitting: Physical Therapy

## 2024-04-24 ENCOUNTER — Other Ambulatory Visit: Payer: Self-pay

## 2024-04-24 ENCOUNTER — Emergency Department (HOSPITAL_BASED_OUTPATIENT_CLINIC_OR_DEPARTMENT_OTHER)
Admission: EM | Admit: 2024-04-24 | Discharge: 2024-04-24 | Disposition: A | Discharge status: Home / Self Care | Source: Ambulatory Visit | Attending: Emergency Medicine | Admitting: Emergency Medicine

## 2024-04-24 ENCOUNTER — Emergency Department (HOSPITAL_BASED_OUTPATIENT_CLINIC_OR_DEPARTMENT_OTHER)

## 2024-04-24 ENCOUNTER — Encounter (HOSPITAL_BASED_OUTPATIENT_CLINIC_OR_DEPARTMENT_OTHER): Payer: Self-pay | Admitting: Emergency Medicine

## 2024-04-24 DIAGNOSIS — I82811 Embolism and thrombosis of superficial veins of right lower extremities: Secondary | ICD-10-CM | POA: Insufficient documentation

## 2024-04-24 DIAGNOSIS — Z794 Long term (current) use of insulin: Secondary | ICD-10-CM | POA: Diagnosis not present

## 2024-04-24 DIAGNOSIS — Z7901 Long term (current) use of anticoagulants: Secondary | ICD-10-CM | POA: Diagnosis not present

## 2024-04-24 DIAGNOSIS — E119 Type 2 diabetes mellitus without complications: Secondary | ICD-10-CM | POA: Insufficient documentation

## 2024-04-24 DIAGNOSIS — M79604 Pain in right leg: Secondary | ICD-10-CM | POA: Diagnosis present

## 2024-04-24 MED ORDER — APIXABAN 5 MG PO TABS: ORAL_TABLET | ORAL | 0 refills | Status: AC

## 2024-04-24 NOTE — ED Triage Notes (Signed)
 Right lower leg pain and swelling started Saturday Denies hx blood clot Recent plane travel Denies SOB

## 2024-04-24 NOTE — ED Provider Notes (Signed)
 [place] EMERGENCY DEPARTMENT AT [facility] Provider Note   CSN: [id number] Arrival date & time: 04/24/24  1652     Patient presents with: Leg Pain   Jesus Gray is a 48 y.o. male presenting for primary concern of right calf pain.  This began acutely 4 days prior, has not had any previous VTE or DVT, denies any chest pain or shortness of breath.  Has progressively worsened over the last several days.  Only previous medical history noted is of type 2 diabetes.    Leg Pain      Prior to Admission medications   Medication Sig Start Date End Date Taking? Authorizing Provider  apixaban  (ELIQUIS ) 5 MG TABS tablet Take 2 tablets (10mg ) twice daily for 7 days, then 1 tablet (5mg ) twice daily 04/24/24  Yes Myriam Dorn BROCKS, PA  bisoprolol-hydrochlorothiazide (ZIAC) 10-6.25 MG tablet Take 1 tablet by mouth daily.    [provider]  etodolac  (LODINE ) 400 MG tablet Take 1 tablet (400 mg total) by mouth daily. 1 tablet by mouth PRN 03/09/22   Kirsteins, Prentice BRAVO, MD  OZEMPIC, 1 MG/DOSE, 4 MG/3ML SOPN Inject 4 mg into the skin once a week. 02/27/22   [provider]  valACYclovir (VALTREX) 1000 MG tablet Take 1,000 mg by mouth daily. 01/18/22   [provider]    Allergies: Amlodipine    Review of Systems  Musculoskeletal:  Positive for myalgias.  All other systems reviewed and are negative.   Updated Vital Signs BP (!) 140/84 (BP Location: Left Arm)   Pulse 73   Temp 98 F (36.7 C) (Oral)   Resp 15   SpO2 98%   Physical Exam Vitals and nursing note reviewed.  Constitutional:      General: He is not in acute distress.    Appearance: Normal appearance.  HENT:     Head: Normocephalic and atraumatic.     Mouth/Throat:     Mouth: Mucous membranes are moist.     Pharynx: Oropharynx is clear.  Eyes:     Extraocular Movements: Extraocular movements intact.     Conjunctiva/sclera: Conjunctivae normal.     Pupils: Pupils are equal, round,  and reactive to light.  Cardiovascular:     Rate and Rhythm: Normal rate and regular rhythm.     Pulses: Normal pulses.     Heart sounds: Normal heart sounds. No murmur heard.    No friction rub. No gallop.  Pulmonary:     Effort: Pulmonary effort is normal.     Breath sounds: Normal breath sounds.  Abdominal:     General: Abdomen is flat. Bowel sounds are normal.     Palpations: Abdomen is soft.  Musculoskeletal:        General: Normal range of motion.     Cervical back: Normal range of motion and neck supple.     Right knee: Tenderness present.     Left knee: Normal.     Right lower leg: Tenderness present. No edema.     Left lower leg: Normal. No edema.     Comments: There is tenderness at the right popliteal fossa as well as to the medial proximal right calf, no appreciable edema of the bilateral lower extremities.  Skin:    General: Skin is warm and dry.     Capillary Refill: Capillary refill takes less than 2 seconds.  Neurological:     General: No focal deficit present.     Mental Status: He is alert. Mental  status is at baseline.  Psychiatric:        Mood and Affect: Mood normal.     (all labs ordered are listed, but only abnormal results are displayed) Labs Reviewed - No data to display  EKG: None  Radiology: US  Venous Img Lower Unilateral Right Result Date: 04/24/2024 EXAM: ULTRASOUND DUPLEX OF THE RIGHT LOWER EXTREMITY VEINS TECHNIQUE: Duplex ultrasound using B-mode/gray scaled imaging and Doppler spectral analysis and color flow was obtained of the deep venous structures of the right lower extremity. COMPARISON: None. CLINICAL HISTORY: Right lower leg swelling and pain after travel. FINDINGS: The visualized veins of the lower extremity are patent and free of echogenic thrombus. The veins demonstrate good compressibility with normal color flow study and spectral analysis. Echogenic peripheral segmentally occlusive thrombus within the great saphenous vein from the mid  thigh to the upper calf favored to represent chronic SVT. IMPRESSION: 1. No evidence of DVT. 2. Echogenic peripheral segmentally occlusive thrombus within the great saphenous vein from the mid thigh to the upper calf, favored to represent chronic SVT. Electronically signed by: Norman Gatlin MD 04/24/2024 08:31 PM EDT RP Workstation: [id number]     Procedures   Medications Ordered in the ED - No data to display                                  Medical Decision Making  Based on the patient's presenting symptoms, consider possible differential of of DVT, cellulitis.  Cellulitis less likely as there is no surrounding erythema or induration, no wounds noted in the immediate area.  DVT study was undertaken of the right lower extremity, this was negative for DVT but did represent a superficial venous thrombosis.  As such, we will go ahead and initiate anticoagulants at this time and have him follow-up with primary care for reimaging to ensure resolution.  Can continue to use NSAIDs for pain management.  Remainder of evaluation was unremarkable, physical exam is reassuring as he has good distal pulses and no edema noted to the bilateral lower extremities.  Care plan explained to the patient, he has no further concerns at this time.     Final diagnoses:  Acute superficial venous thrombosis of right lower extremity    ED Discharge Orders          Ordered    apixaban  (ELIQUIS ) 5 MG TABS tablet        04/24/24 2134               Myriam Dorn BROCKS, [place] 04/24/24 2134    Lenor Hollering, MD 04/24/24 2324

## 2024-05-23 ENCOUNTER — Other Ambulatory Visit (HOSPITAL_BASED_OUTPATIENT_CLINIC_OR_DEPARTMENT_OTHER): Payer: Self-pay

## 2024-05-23 ENCOUNTER — Other Ambulatory Visit: Payer: Self-pay

## 2024-06-20 ENCOUNTER — Other Ambulatory Visit: Payer: Self-pay | Admitting: *Deleted

## 2024-06-20 DIAGNOSIS — I8393 Asymptomatic varicose veins of bilateral lower extremities: Secondary | ICD-10-CM

## 2024-06-26 ENCOUNTER — Ambulatory Visit (HOSPITAL_COMMUNITY)
Admission: RE | Admit: 2024-06-26 | Discharge: 2024-06-26 | Disposition: A | Discharge status: Home / Self Care | Source: Ambulatory Visit | Attending: Vascular Surgery | Admitting: Vascular Surgery

## 2024-06-26 DIAGNOSIS — I8393 Asymptomatic varicose veins of bilateral lower extremities: Secondary | ICD-10-CM | POA: Diagnosis present

## 2024-07-22 NOTE — Progress Notes (Unsigned)
 [facility]           [facility] [place]  History and Physical   Jesus Gray is a 48 y.o. male who presents with ***  ***  The pt does *** have hx of previous venous procedures. The patient has *** history of DVT. Pt does *** history of varicose vein.   Pt does *** history of skin changes in lower legs.   There is *** family history of venous disorders.   The patient has *** used compression stockings in the past.    The pt is not on a statin for cholesterol management.  The pt is not on a daily aspirin.   Other AC:  eliquis  The pt is on BB, diuretic for hypertension.   The pt is not on medication for diabetes.   Tobacco hx:  never  Pt does *** have family hx of AAA.  Past Medical History:  Diagnosis Date   Diabetes mellitus without complication (HCC)    Hypertension     No past surgical history on file.  Social History   Socioeconomic History   Marital status: Married    Spouse name: Not on file   Number of children: Not on file   Years of education: Not on file   Highest education level: Not on file  Occupational History   Not on file  Tobacco Use   Smoking status: Never   Smokeless tobacco: Never  Substance and Sexual Activity   Alcohol use: Not on file   Drug use: Not on file   Sexual activity: Not on file  Other Topics Concern   Not on file  Social History Narrative   Not on file   Social Drivers of Health   Financial Resource Strain: Not on file  Food Insecurity: Not on file  Transportation Needs: Not on file  Physical Activity: Not on file  Stress: Not on file  Social Connections: Not on file  Intimate Partner Violence: Not on file     Family History  Problem Relation Age of Onset   Hypertension Other    Hyperlipidemia Other    Diabetes Other     Current Outpatient Medications  Medication Sig Dispense Refill   apixaban  (ELIQUIS ) 5 MG TABS tablet Take 2 tablets (10mg ) twice daily for 7 days, then 1  tablet (5mg ) twice daily 60 tablet 0   bisoprolol-hydrochlorothiazide (ZIAC) 10-6.25 MG tablet Take 1 tablet by mouth daily.     etodolac  (LODINE ) 400 MG tablet Take 1 tablet (400 mg total) by mouth daily. 1 tablet by mouth PRN 30 tablet 1   OZEMPIC, 1 MG/DOSE, 4 MG/3ML SOPN Inject 4 mg into the skin once a week.     valACYclovir (VALTREX) 1000 MG tablet Take 1,000 mg by mouth daily.     No current facility-administered medications for this visit.    Allergies  Allergen Reactions   Amlodipine Swelling    Gum swelling    REVIEW OF SYSTEMS:  *** [X]  denotes positive finding, [ ]  denotes negative finding Cardiac  Comments:  Chest pain or chest pressure:    Shortness of breath upon exertion:    Short of breath when lying flat:    Irregular heart rhythm:        Vascular    Pain in calf, thigh, or hip brought on by ambulation:    Pain in feet at night that wakes you up from your sleep:  Blood clot in your veins: x   Leg swelling:  x       Pulmonary    Oxygen at home:    Productive cough:     Wheezing:         Neurologic    Sudden weakness in arms or legs:     Sudden numbness in arms or legs:     Sudden onset of difficulty speaking or slurred speech:    Temporary loss of vision in one eye:     Problems with dizziness:         Gastrointestinal    Blood in stool:     Vomited blood:         Genitourinary    Burning when urinating:     Blood in urine:        Psychiatric    Major depression:         Hematologic    Bleeding problems:    Problems with blood clotting too easily:        Skin    Rashes or ulcers:        Constitutional    Fever or chills:      PHYSICAL EXAMINATION:  ***  General:  WDWN in NAD; vital signs documented above Gait: Not observed HENT: WNL, normocephalic Pulmonary: normal non-labored breathing without wheezing Cardiac: {Desc; regular/irreg:14544} HR; {With/Without:20273} carotid bruit*** Abdomen: soft, NT, aortic pulse is ***  palpable Skin: {With/Without:20273} rashes Vascular Exam/Pulses:  Right Left  Radial {Exam; arterial pulse strength 0-4:30167} {Exam; arterial pulse strength 0-4:30167}  DP {Exam; arterial pulse strength 0-4:30167} {Exam; arterial pulse strength 0-4:30167}  PT {Exam; arterial pulse strength 0-4:30167} {Exam; arterial pulse strength 0-4:30167}   Extremities: ***  Neurologic: A&O X 3;  moving all extremities equally Psychiatric:  The pt has {Desc; normal/abnormal:11317::Normal} affect.   Non-Invasive Vascular Imaging:   Venous duplex on 06/27/2024: Venous Reflux Times  +--------------+---------+------+-----------+------------+--------------+  RIGHT        Reflux NoRefluxReflux TimeDiameter cmsComments                                Yes                                         +--------------+---------+------+-----------+------------+--------------+  CFV                    yes                                         +--------------+---------+------+-----------+------------+--------------+  FV mid        no                                                    +--------------+---------+------+-----------+------------+--------------+  Popliteal    no                                                    +--------------+---------+------+-----------+------------+--------------+  GSV  at [facility]              yes    >500 ms      1.19                    +--------------+---------+------+-----------+------------+--------------+  GSV prox thighno                            0.51                    +--------------+---------+------+-----------+------------+--------------+  GSV mid thigh           yes    >500 ms      0.42                    +--------------+---------+------+-----------+------------+--------------+  GSV dist thigh                                      acute thrombus   +--------------+---------+------+-----------+------------+--------------+  GSV at knee                                         acute thrombus  +--------------+---------+------+-----------+------------+--------------+  GSV prox calf                                       acute thrombus  +--------------+---------+------+-----------+------------+--------------+  GSV mid calf  no                            0.35                    +--------------+---------+------+-----------+------------+--------------+  SSV at [facility]    no                            0.47                    +--------------+---------+------+-----------+------------+--------------+  SSV prox calf           yes    >500 ms      0.24                    +--------------+---------+------+-----------+------------+--------------+   Summary:  Right:  - No evidence of deep vein thrombosis seen in the right lower extremity,  from the common femoral through the popliteal veins.  - Color duplex evaluation of the right lower extremity shows there is  thrombus in the distal greater saphenous vein.  - Venous reflux is noted in the right common femoral vein.  - Venous reflux is noted in the right greater saphenous vein in the thigh.  - Venous reflux is noted in the right short saphenous vein.  - Acute partial thrombosis seen in the GSV from Distal Thigh through  Proximal Calf.     Jesus Gray is a 48 y.o. male who presents with: ***    -pt has *** pedal pulses -in the *** lower extremity, the pt does *** have evidence of DVT.  Pt does ***have venous reflux *** -discussed with pt about wearing *** high *** mmHg compression stockings  and pt was measured for these today.   *** -discussed the importance of leg elevation and how to elevate properly - pt is advised to elevate their legs and a diagram is given to them to demonstrate for pt to lay flat on their back with knees elevated and slightly bent with  their feet higher than their knees, which puts their feet higher than their heart for 15 minutes per day.  If pt cannot lay flat, advised to lay as flat as possible.  -pt is advised to continue as much walking as possible and avoid sitting or standing for long periods of time.  -discussed importance of weight loss and exercise and that water aerobics would also be beneficial.  -handout with recommendations given -pt will f/u ***   Lucie Apt, [facility] [phone]  Clinic MD:  Sheree

## 2024-07-24 ENCOUNTER — Encounter: Payer: Self-pay | Admitting: Physician Assistant

## 2024-07-24 ENCOUNTER — Ambulatory Visit: Attending: Surgery

## 2024-07-24 VITALS — BP 135/81 | HR 89 | Temp 97.7°F | Wt 248.2 lb

## 2024-07-24 DIAGNOSIS — I82811 Embolism and thrombosis of superficial veins of right lower extremities: Secondary | ICD-10-CM | POA: Diagnosis not present

## 2024-07-24 NOTE — Patient Instructions (Signed)

## 2024-09-24 ENCOUNTER — Encounter (HOSPITAL_COMMUNITY)

## 2024-09-24 ENCOUNTER — Encounter
